# Patient Record
Sex: Female | Born: 2000 | Race: Black or African American | Hispanic: No | Marital: Single | Smoking: Current every day smoker
Health system: Southern US, Community
[De-identification: ages and names within clinical notes are randomized; demographics above are authoritative.]

## PROBLEM LIST (undated history)

## (undated) ENCOUNTER — Ambulatory Visit (HOSPITAL_COMMUNITY): Admission: EM | Payer: Medicaid Other

## (undated) DIAGNOSIS — J45909 Unspecified asthma, uncomplicated: Secondary | ICD-10-CM

## (undated) DIAGNOSIS — L0232 Furuncle of buttock: Secondary | ICD-10-CM

## (undated) DIAGNOSIS — L309 Dermatitis, unspecified: Secondary | ICD-10-CM

## (undated) DIAGNOSIS — T7840XA Allergy, unspecified, initial encounter: Secondary | ICD-10-CM

## (undated) HISTORY — DX: Unspecified asthma, uncomplicated: J45.909

## (undated) HISTORY — DX: Allergy, unspecified, initial encounter: T78.40XA

## (undated) HISTORY — PX: NO PAST SURGERIES: SHX2092

---
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
## 2018-01-01 ENCOUNTER — Other Ambulatory Visit: Payer: Self-pay | Admitting: Pediatrics

## 2018-01-01 DIAGNOSIS — J4530 Mild persistent asthma, uncomplicated: Secondary | ICD-10-CM

## 2018-01-05 ENCOUNTER — Other Ambulatory Visit: Payer: Self-pay | Admitting: Pediatrics

## 2018-01-05 DIAGNOSIS — J4530 Mild persistent asthma, uncomplicated: Secondary | ICD-10-CM

## 2018-03-06 ENCOUNTER — Encounter (HOSPITAL_COMMUNITY): Payer: Self-pay | Admitting: Emergency Medicine

## 2018-03-06 ENCOUNTER — Emergency Department (HOSPITAL_COMMUNITY)
Admission: EM | Admit: 2018-03-06 | Discharge: 2018-03-06 | Disposition: A | Discharge status: Home / Self Care | Payer: Medicaid Other | Attending: Emergency Medicine | Admitting: Emergency Medicine

## 2018-03-06 DIAGNOSIS — Z91010 Allergy to peanuts: Secondary | ICD-10-CM | POA: Diagnosis not present

## 2018-03-06 DIAGNOSIS — J45909 Unspecified asthma, uncomplicated: Secondary | ICD-10-CM | POA: Diagnosis not present

## 2018-03-06 DIAGNOSIS — H60502 Unspecified acute noninfective otitis externa, left ear: Secondary | ICD-10-CM | POA: Diagnosis not present

## 2018-03-06 DIAGNOSIS — Z7722 Contact with and (suspected) exposure to environmental tobacco smoke (acute) (chronic): Secondary | ICD-10-CM | POA: Insufficient documentation

## 2018-03-06 DIAGNOSIS — Z79899 Other long term (current) drug therapy: Secondary | ICD-10-CM | POA: Insufficient documentation

## 2018-03-06 DIAGNOSIS — H9202 Otalgia, left ear: Secondary | ICD-10-CM | POA: Diagnosis present

## 2018-03-06 MED ORDER — OFLOXACIN 0.3 % OT SOLN: 10.0000 [drp] | Freq: Every day | OTIC | 0 refills | Status: AC

## 2018-03-06 NOTE — Discharge Instructions (Addendum)
Please use the antibiotic drops as advised on your discharge paperwork.  Please follow-up with your primary doctor in 1 week for reevaluation return to the ER for any new or worsening symptoms.  You were noted to have high blood pressure today during your visit in the emergency department. You will need to follow up with your primary healthcare provider to have your blood pressure rechecked as you may need to be started on medication for this if it remains elevated. If you experience any chest pain, shortness of breath, headaches, vision changes, numbness, weakness, lightheadedness, changes in mental status, or decrease in urination you should return to the emergency department immediately.

## 2018-03-06 NOTE — ED Triage Notes (Signed)
Patient here from home with complaints of left ear pain x2 days. Headache. Advil with no relief.

## 2018-03-06 NOTE — ED Provider Notes (Addendum)
[facility]-EMERGENCY DEPT Provider Note   CSN: [id number] Arrival date & time: 03/06/18  1423     History   Chief Complaint Chief Complaint  Patient presents with  . Otalgia    HPI Gina Yang is a 17 y.o. female.  HPI  17 year old female with history of allergies, asthma, eczema who presents the emergency department today for evaluation of left ear pain which began 2 days ago.  States that she was swimming last week in [place].  She used a Q-tip yesterday and then felt like she had pain in her ear.  She notes some mild yellow drainage from the ear.  She has some subjective fevers at home.  States her grandma felt her head and thought she felt warm.  She has not taken any medications for her symptoms.  She states that she put hydrogen peroxide in her ear and use some Debrox that she had at home.  Symptoms are constant, severe.  No exacerbating or alleviating factors.  Past Medical History:  Diagnosis Date  . Allergy   . Asthma   . Boil of buttock   . Eczema     Patient Active Problem List   Diagnosis Date Noted  . Elevated BP without diagnosis of hypertension 06/02/2017  . Obesity 06/02/2017  . School problem 11/29/2015  . IUD (intrauterine device) in place 10/10/2015  . Hx MRSA infection 06/28/2015  . Acne 09/21/2014  . Eczema 12/28/2013  . Mild persistent asthma 12/28/2013  . Allergic rhinitis 12/28/2013    Past Surgical History:  Procedure Laterality Date  . NO PAST SURGERIES       OB History    Gravida  0   Para      Term      Preterm      AB      Living        SAB      TAB      Ectopic      Multiple      Live Births               Home Medications    Prior to Admission medications   Medication Sig Start Date End Date Taking? Authorizing Provider  beclomethasone (QVAR) 40 MCG/ACT inhaler Inhale 2 puffs into the lungs 2 (two) times daily. 06/05/16   Verneda Skill, FNP  diphenhydrAMINE (BENADRYL)  25 MG tablet Take 25 mg by mouth at bedtime.    [provider]  fluconazole (DIFLUCAN) 150 MG tablet Take 1 tablet today and 1 tablet three days from now 12/31/17   Verneda Skill, FNP  fluticasone [facility]) 50 MCG/ACT nasal spray Place 1 spray into both nostrils 2 (two) times daily. 09/30/16   Christianne Dolin, NP  ibuprofen (ADVIL,MOTRIN) 600 MG tablet Take 1 tablet (600 mg total) by mouth every 6 (six) hours as needed. 11/10/17   Ancil Linsey, MD  ofloxacin (FLOXIN) 0.3 % OTIC solution Place 10 drops into the left ear daily for 7 days. 03/06/18 03/13/18  Sian Joles S, PA-C  polyethylene glycol powder (GLYCOLAX/MIRALAX) powder Take 17 g by mouth daily. 05/26/17   Verneda Skill, FNP  PROAIR HFA 108 [phone] Base) MCG/ACT inhaler INHALE 2 PUFFS BY MOUTH EVERY 4 HOURS IF NEEDED FOR SHORTNESS OF BREATH WHEEZING 01/06/18   Ancil Linsey, MD  triamcinolone ointment (KENALOG) 0.5 % APPLY TO AFFECTED AREA TWICE A DAY 01/01/18   Christianne Dolin, NP    Family History Family  History  Problem Relation Age of Onset  . Eczema Brother     Social History Social History   Tobacco Use  . Smoking status: Passive Smoke Exposure - Never Smoker  . Smokeless tobacco: Never Used  . Tobacco comment: mom smokes  Substance Use Topics  . Alcohol use: No    Alcohol/week: 0.0 oz  . Drug use: No     Allergies   Morphine and related; Peanut-containing drug products; and Shrimp [shellfish allergy]   Review of Systems Review of Systems  Constitutional: Negative for fever.  HENT: Positive for ear pain. Negative for congestion, dental problem, postnasal drip, rhinorrhea and sore throat.   Eyes: Negative for visual disturbance.  Respiratory: Negative for shortness of breath.   Cardiovascular: Negative for chest pain.  Gastrointestinal: Negative for abdominal pain.  Genitourinary: Negative for pelvic pain.  Musculoskeletal: Negative for back pain.  Skin: Negative for wound.  Neurological:  Negative for headaches.   Physical Exam Updated Vital Signs BP (!) 144/100 (BP Location: Right Arm)   Pulse (!) 101   Temp 97.8 F (36.6 C) (Oral)   Resp 18   SpO2 98%   Physical Exam  Constitutional: She is oriented to person, place, and time. She appears well-developed and well-nourished. No distress.  HENT:  Head: Normocephalic and atraumatic.  Right TM without erythema or effusion.  External auditory canal on the left has findings consistent with otitis externa.  No mastoid tenderness.  Pharyngeal erythema.  No tonsillar swelling or exudates.  Uvula midline.  No cervical adenopathy.  Eyes: Pupils are equal, round, and reactive to light. Conjunctivae and EOM are normal.  Neck: Neck supple.  Cardiovascular: Normal rate and regular rhythm.  Pulmonary/Chest: Effort normal and breath sounds normal. She has no wheezes.  Musculoskeletal: Normal range of motion.  Neurological: She is alert and oriented to person, place, and time.  Skin: Skin is warm and dry. Capillary refill takes less than 2 seconds.  Psychiatric: She has a normal mood and affect.  Nursing note and vitals reviewed.  ED Treatments / Results  Labs (all labs ordered are listed, but only abnormal results are displayed) Labs Reviewed - No data to display  EKG None  Radiology No results found.  Procedures Procedures (including critical care time)  Medications Ordered in ED Medications - No data to display   Initial Impression / Assessment and Plan / ED Course  I have reviewed the triage vital signs and the nursing notes.  Pertinent labs & imaging results that were available during my care of the patient were reviewed by me and considered in my medical decision making (see chart for details).     Final Clinical Impressions(s) / ED Diagnoses   Final diagnoses:  Acute otitis externa of left ear, unspecified type   Pt presenting with otitis externa after swimming. Pt afebrile in NAD. Exam non concerning for  mastoiditis, cellulitis or malignant OE. Dc with ofloxacin script.  Advised pcp follow up in 2-3 days if no improvement with treatment or no complete resolution by 7 days. Return precautions discussed. All questions answered.  Patient's blood pressure elevated in the emergency department today. Patient denies headache, change in vision, numbness, weakness, chest pain, dyspnea, dizziness, or lightheadedness therefore doubt hypertensive emergency. Discussed elevated blood pressure with the patient and the need for primary care follow up with potential need to initiate or change antihypertensive medications and or for further evaluation. Advised return precaution signs/symptoms for hypertensive emergency as listed above with the patient.  ED Discharge Orders        Ordered    ofloxacin (FLOXIN) 0.3 % OTIC solution  Daily     03/06/18 [address] Eather Colas, PA-C 03/06/18 1625    Modean Mccullum S, PA-C 03/06/18 1626    Pricilla Loveless, MD 03/06/18 2236

## 2018-04-02 ENCOUNTER — Other Ambulatory Visit: Payer: Self-pay | Admitting: Pediatrics

## 2018-04-02 ENCOUNTER — Telehealth: Payer: Self-pay | Admitting: *Deleted

## 2018-04-02 MED ORDER — TRIAMCINOLONE ACETONIDE 0.5 % EX OINT: TOPICAL_OINTMENT | CUTANEOUS | 0 refills | Status: DC

## 2018-04-02 NOTE — Telephone Encounter (Signed)
Done

## 2018-04-02 NOTE — Telephone Encounter (Signed)
Patient calling for refill for triamcinolone cream.

## 2018-04-02 NOTE — Telephone Encounter (Signed)
Called and made patient aware. 

## 2018-04-13 ENCOUNTER — Ambulatory Visit: Payer: Medicaid Other

## 2018-04-15 ENCOUNTER — Ambulatory Visit: Payer: Medicaid Other | Admitting: Pediatrics

## 2018-04-28 ENCOUNTER — Inpatient Hospital Stay (HOSPITAL_COMMUNITY)
Admission: AD | Admit: 2018-04-28 | Discharge: 2018-04-29 | Discharge status: Against Medical Advice | Payer: Medicaid Other | Source: Ambulatory Visit | Attending: Obstetrics & Gynecology | Admitting: Obstetrics & Gynecology

## 2018-04-28 ENCOUNTER — Encounter (HOSPITAL_COMMUNITY): Payer: Self-pay

## 2018-04-28 DIAGNOSIS — Z3202 Encounter for pregnancy test, result negative: Secondary | ICD-10-CM | POA: Diagnosis present

## 2018-04-28 DIAGNOSIS — J45909 Unspecified asthma, uncomplicated: Secondary | ICD-10-CM | POA: Insufficient documentation

## 2018-04-28 DIAGNOSIS — Z7951 Long term (current) use of inhaled steroids: Secondary | ICD-10-CM | POA: Insufficient documentation

## 2018-04-28 DIAGNOSIS — Z7722 Contact with and (suspected) exposure to environmental tobacco smoke (acute) (chronic): Secondary | ICD-10-CM | POA: Insufficient documentation

## 2018-04-28 DIAGNOSIS — Z791 Long term (current) use of non-steroidal anti-inflammatories (NSAID): Secondary | ICD-10-CM | POA: Insufficient documentation

## 2018-04-28 DIAGNOSIS — Z91010 Allergy to peanuts: Secondary | ICD-10-CM | POA: Diagnosis not present

## 2018-04-28 DIAGNOSIS — Z79899 Other long term (current) drug therapy: Secondary | ICD-10-CM | POA: Insufficient documentation

## 2018-04-28 DIAGNOSIS — Z8489 Family history of other specified conditions: Secondary | ICD-10-CM | POA: Diagnosis not present

## 2018-04-28 DIAGNOSIS — Z91013 Allergy to seafood: Secondary | ICD-10-CM | POA: Insufficient documentation

## 2018-04-28 DIAGNOSIS — Z885 Allergy status to narcotic agent status: Secondary | ICD-10-CM | POA: Diagnosis not present

## 2018-04-28 LAB — POCT PREGNANCY, URINE: Preg Test, Ur: NEGATIVE

## 2018-04-28 NOTE — MAU Note (Addendum)
Pt here with reports of positive pregnancy test at home; nausea for past couple of days, reports some mild spotting, mid-abdominal pain. Has had an IUD in place for past 2 years.

## 2018-04-29 ENCOUNTER — Inpatient Hospital Stay (EMERGENCY_DEPARTMENT_HOSPITAL)
Admission: AD | Admit: 2018-04-29 | Discharge: 2018-04-29 | Disposition: A | Discharge status: Home / Self Care | Payer: Medicaid Other | Source: Ambulatory Visit | Attending: Obstetrics & Gynecology | Admitting: Obstetrics & Gynecology

## 2018-04-29 DIAGNOSIS — Z3202 Encounter for pregnancy test, result negative: Secondary | ICD-10-CM

## 2018-04-29 LAB — CBC
HCT: 35.7 % — ABNORMAL LOW (ref 36.0–46.0)
Hemoglobin: 11.9 g/dL — ABNORMAL LOW (ref 12.0–15.0)
MCH: 27.8 pg (ref 26.0–34.0)
MCHC: 33.3 g/dL (ref 30.0–36.0)
MCV: 83.4 fL (ref 78.0–100.0)
PLATELETS: 353 10*3/uL (ref 150–400)
RBC: 4.28 MIL/uL (ref 3.87–5.11)
RDW: 14.7 % (ref 11.5–15.5)
WBC: 7.4 10*3/uL (ref 4.0–10.5)

## 2018-04-29 LAB — URINALYSIS, ROUTINE W REFLEX MICROSCOPIC
Bilirubin Urine: NEGATIVE
GLUCOSE, UA: NEGATIVE mg/dL
HGB URINE DIPSTICK: NEGATIVE
Ketones, ur: NEGATIVE mg/dL
LEUKOCYTES UA: NEGATIVE
Nitrite: NEGATIVE
PH: 7 (ref 5.0–8.0)
Protein, ur: NEGATIVE mg/dL
Specific Gravity, Urine: 1.004 — ABNORMAL LOW (ref 1.005–1.030)

## 2018-04-29 LAB — ABO/RH: ABO/RH(D): A POS

## 2018-04-29 LAB — HCG, QUANTITATIVE, PREGNANCY

## 2018-04-29 NOTE — Discharge Instructions (Signed)

## 2018-04-29 NOTE — MAU Note (Signed)
Pt back after leaving AMA to go pick up her brother. Pt here for pregnancy confirmation. Having cramping, took plan B pill about 2200 tonight after her pregnancy test was positive.

## 2018-04-29 NOTE — MAU Note (Signed)
Pt called out saying she needs to leave now; has to go pick up her brother. She states that she will check back in later. Pt signed a AMA form and left the unit.

## 2018-04-29 NOTE — MAU Provider Note (Signed)
Chief Complaint: Possible Pregnancy   First Provider Initiated Contact with Patient 04/29/18 0117     SUBJECTIVE HPI: Taren Dymek is a 17 y.o. G0P0 non pregnant female who presents to Maternity Admissions reporting possible pregnancy. Had positive HPT earlier today with faint line. Had some spotting a few days ago. Denies abdominal pain. Has had IUD in place x 2 years.     Past Medical History:  Diagnosis Date  . Allergy   . Asthma   . Boil of buttock   . Eczema    OB History  Gravida Para Term Preterm AB Living  0            SAB TAB Ectopic Multiple Live Births              Past Surgical History:  Procedure Laterality Date  . NO PAST SURGERIES     Social History   Socioeconomic History  . Marital status: Single    Spouse name: Not on file  . Number of children: Not on file  . Years of education: Not on file  . Highest education level: Not on file  Occupational History  . Not on file  Social Needs  . Financial resource strain: Not on file  . Food insecurity:    Worry: Not on file    Inability: Not on file  . Transportation needs:    Medical: Not on file    Non-medical: Not on file  Tobacco Use  . Smoking status: Passive Smoke Exposure - Never Smoker  . Smokeless tobacco: Never Used  . Tobacco comment: mom smokes  Substance and Sexual Activity  . Alcohol use: No    Alcohol/week: 0.0 standard drinks  . Drug use: No  . Sexual activity: Never    Birth control/protection: IUD    Comment: pt reports she has never had sex  Lifestyle  . Physical activity:    Days per week: Not on file    Minutes per session: Not on file  . Stress: Not on file  Relationships  . Social connections:    Talks on phone: Not on file    Gets together: Not on file    Attends religious service: Not on file    Active member of club or organization: Not on file    Attends meetings of clubs or organizations: Not on file    Relationship status: Not on file  . Intimate partner  violence:    Fear of current or ex partner: Not on file    Emotionally abused: Not on file    Physically abused: Not on file    Forced sexual activity: Not on file  Other Topics Concern  . Not on file  Social History Narrative  . Not on file   Family History  Problem Relation Age of Onset  . Eczema Brother    No current facility-administered medications on file prior to encounter.    Current Outpatient Medications on File Prior to Encounter  Medication Sig Dispense Refill  . beclomethasone (QVAR) 40 MCG/ACT inhaler Inhale 2 puffs into the lungs 2 (two) times daily. 1 Inhaler 3  . diphenhydrAMINE (BENADRYL) 25 MG tablet Take 25 mg by mouth at bedtime.    . fluconazole (DIFLUCAN) 150 MG tablet Take 1 tablet today and 1 tablet three days from now 2 tablet 0  . fluticasone (FLONASE) 50 MCG/ACT nasal spray Place 1 spray into both nostrils 2 (two) times daily. 16 g 12  . ibuprofen (ADVIL,MOTRIN) 600 MG tablet  Take 1 tablet (600 mg total) by mouth every 6 (six) hours as needed. 30 tablet 0  . polyethylene glycol powder (GLYCOLAX/MIRALAX) powder Take 17 g by mouth daily. 578 g 6  . PROAIR HFA 108 (90 Base) MCG/ACT inhaler INHALE 2 PUFFS BY MOUTH EVERY 4 HOURS IF NEEDED FOR SHORTNESS OF BREATH WHEEZING 17 g 0  . triamcinolone ointment (KENALOG) 0.5 % APPLY TO AFFECTED AREA TWICE A DAY 30 g 0   Allergies  Allergen Reactions  . Morphine And Related Itching and Nausea And Vomiting  . Peanut-Containing Drug Products Swelling    Swelling in the throat  . Shrimp [Shellfish Allergy] Swelling    Swelling in the throat    I have reviewed patient's Past Medical Hx, Surgical Hx, Family Hx, Social Hx, medications and allergies.   Review of Systems  Gastrointestinal: Negative.   Genitourinary: Negative.     OBJECTIVE Patient Vitals for the past 24 hrs:  BP Temp Temp src Pulse Resp SpO2  04/29/18 0113 139/80 98 F (36.7 C) Oral 92 18 100 %   Constitutional: Well-developed, well-nourished  female in no acute distress.  Respiratory: normal rate and effort. Neurologic: Alert and oriented x 4.     LAB RESULTS Results for orders placed or performed during the hospital encounter of 04/29/18 (from the past 24 hour(s))  CBC     Status: Abnormal   Collection Time: 04/29/18  1:26 AM  Result Value Ref Range   WBC 7.4 4.0 - 10.5 K/uL   RBC 4.28 3.87 - 5.11 MIL/uL   Hemoglobin 11.9 (L) 12.0 - 15.0 g/dL   HCT 40.9 (L) 73.5 - 32.9 %   MCV 83.4 78.0 - 100.0 fL   MCH 27.8 26.0 - 34.0 pg   MCHC 33.3 30.0 - 36.0 g/dL   RDW 92.4 26.8 - 34.1 %   Platelets 353 150 - 400 K/uL  hCG, quantitative, pregnancy     Status: None   Collection Time: 04/29/18  1:26 AM  Result Value Ref Range   hCG, Beta Chain, Quant, S <1 <5 mIU/mL  ABO/Rh     Status: None (Preliminary result)   Collection Time: 04/29/18  1:26 AM  Result Value Ref Range   ABO/RH(D)      A POS Performed at [facility], [address] [place], [place] [address]     IMAGING No results found.  MAU COURSE Orders Placed This Encounter  Procedures  . CBC  . hCG, quantitative, pregnancy  . ABO/Rh  . Discharge patient   No orders of the defined types were placed in this encounter.   MDM UPT negative HCG negative  ASSESSMENT 1. Pregnancy examination or test, negative result     PLAN Discharge home in stable condition.  Follow-up Information    [name], MD Follow up.   Specialty:  Pediatrics Contact information: [address] [place] [address] [place] [address] [phone]          Allergies as of 04/29/2018      Reactions   Morphine And Related Itching, Nausea And Vomiting   Peanut-containing Drug Products Swelling   Swelling in the throat   Shrimp [shellfish Allergy] Swelling   Swelling in the throat      Medication List    TAKE these medications   beclomethasone 40 MCG/ACT inhaler Commonly known as:  QVAR Inhale 2 puffs into the lungs 2 (two) times daily.    diphenhydrAMINE 25 MG tablet Commonly known as:  BENADRYL Take  25 mg by mouth at bedtime.   fluconazole 150 MG tablet Commonly known as:  DIFLUCAN Take 1 tablet today and 1 tablet three days from now   fluticasone 50 MCG/ACT nasal spray Commonly known as:  FLONASE Place 1 spray into both nostrils 2 (two) times daily.   ibuprofen 600 MG tablet Commonly known as:  ADVIL,MOTRIN Take 1 tablet (600 mg total) by mouth every 6 (six) hours as needed.   polyethylene glycol powder powder Commonly known as:  GLYCOLAX/MIRALAX Take 17 g by mouth daily.   PROAIR HFA 108 (90 Base) MCG/ACT inhaler Generic drug:  albuterol INHALE 2 PUFFS BY MOUTH EVERY 4 HOURS IF NEEDED FOR SHORTNESS OF BREATH WHEEZING   triamcinolone ointment 0.5 % Commonly known as:  KENALOG APPLY TO AFFECTED AREA TWICE A Sanjuana Mae, NP 04/29/2018  2:52 AM

## 2018-05-05 ENCOUNTER — Ambulatory Visit: Payer: Medicaid Other | Admitting: Family

## 2018-05-10 ENCOUNTER — Ambulatory Visit: Payer: Medicaid Other | Admitting: Family

## 2018-07-10 ENCOUNTER — Other Ambulatory Visit: Payer: Self-pay | Admitting: Pediatrics

## 2018-07-14 ENCOUNTER — Emergency Department (HOSPITAL_COMMUNITY)
Admission: EM | Admit: 2018-07-14 | Discharge: 2018-07-14 | Disposition: A | Discharge status: Home / Self Care | Payer: Medicaid Other | Attending: Emergency Medicine | Admitting: Emergency Medicine

## 2018-07-14 ENCOUNTER — Other Ambulatory Visit: Payer: Self-pay

## 2018-07-14 ENCOUNTER — Emergency Department (HOSPITAL_COMMUNITY): Payer: Medicaid Other

## 2018-07-14 ENCOUNTER — Encounter (HOSPITAL_COMMUNITY): Payer: Self-pay

## 2018-07-14 DIAGNOSIS — R0789 Other chest pain: Secondary | ICD-10-CM | POA: Insufficient documentation

## 2018-07-14 DIAGNOSIS — Z79899 Other long term (current) drug therapy: Secondary | ICD-10-CM | POA: Insufficient documentation

## 2018-07-14 DIAGNOSIS — J45909 Unspecified asthma, uncomplicated: Secondary | ICD-10-CM | POA: Insufficient documentation

## 2018-07-14 DIAGNOSIS — R079 Chest pain, unspecified: Secondary | ICD-10-CM

## 2018-07-14 DIAGNOSIS — Z7722 Contact with and (suspected) exposure to environmental tobacco smoke (acute) (chronic): Secondary | ICD-10-CM | POA: Insufficient documentation

## 2018-07-14 DIAGNOSIS — Z91010 Allergy to peanuts: Secondary | ICD-10-CM | POA: Insufficient documentation

## 2018-07-14 LAB — D-DIMER, QUANTITATIVE (NOT AT ARMC): D DIMER QUANT: 0.32 ug{FEU}/mL (ref 0.00–0.50)

## 2018-07-14 MED ORDER — KETOROLAC TROMETHAMINE 30 MG/ML IJ SOLN
30.0000 mg | Freq: Once | INTRAMUSCULAR | Status: AC
  Administered 2018-07-14: 30 mg via INTRAMUSCULAR
  Filled 2018-07-14: qty 1

## 2018-07-14 MED ORDER — NAPROXEN 500 MG PO TABS: 500.0000 mg | ORAL_TABLET | Freq: Two times a day (BID) | ORAL | 0 refills | Status: DC

## 2018-07-14 NOTE — ED Triage Notes (Signed)
Pt reports L-sided rib pain. She states that it started this morning. She denies injury or SOB. She states that movement worsens the pain. A&Ox4. Ambulatory.

## 2018-07-14 NOTE — Discharge Instructions (Addendum)
You were seen today for left-sided chest pain.  Your work-up is reassuring.  Take naproxen as needed for pain.

## 2018-07-14 NOTE — ED Provider Notes (Signed)
[facility]-EMERGENCY DEPT Provider Note   CSN: [id number] Arrival date & time: 07/14/18  0025     History   Chief Complaint Chief Complaint  Patient presents with  . Rib Pain    HPI Gina Yang is a 17 y.o. female.  HPI  This is an 17 year old female who presents with left-sided chest pain.  Patient reports that she woke up with pain this morning.  She denies injury.  She states the pain is worse with deep breathing.  It is not worse with movement.  She denies any recent cough, fevers, shortness of breath.  She denies any urinary symptoms or flank pain.  No nausea or vomiting.  Currently she rates her pain at 7 out of 10.  She has not taken anything for pain.  Pain does not radiate.  She is on birth control.  She denies history of blood clots, leg swelling, recent hospitalization, recent long travel.  Past Medical History:  Diagnosis Date  . Allergy   . Asthma   . Boil of buttock   . Eczema     Patient Active Problem List   Diagnosis Date Noted  . Elevated BP without diagnosis of hypertension 06/02/2017  . Obesity 06/02/2017  . School problem 11/29/2015  . IUD (intrauterine device) in place 10/10/2015  . Hx MRSA infection 06/28/2015  . Acne 09/21/2014  . Eczema 12/28/2013  . Mild persistent asthma 12/28/2013  . Allergic rhinitis 12/28/2013    Past Surgical History:  Procedure Laterality Date  . NO PAST SURGERIES       OB History    Gravida  0   Para      Term      Preterm      AB      Living        SAB      TAB      Ectopic      Multiple      Live Births               Home Medications    Prior to Admission medications   Medication Sig Start Date End Date Taking? Authorizing Provider  Aspirin-Salicylamide-Caffeine (BC FAST PAIN RELIEF) [phone] MG PACK Take 1 Package by mouth every 6 (six) hours as needed (pain).   Yes [provider]  beclomethasone (QVAR) 40 MCG/ACT inhaler Inhale 2 puffs into  the lungs 2 (two) times daily. 06/05/16  Yes Verneda SkillHacker, Caroline T, FNP  fluticasone (FLONASE) 50 MCG/ACT nasal spray Place 1 spray into both nostrils 2 (two) times daily. 09/30/16  Yes Millican, Christy, NP  polyethylene glycol powder (GLYCOLAX/MIRALAX) powder Take 17 g by mouth daily. Patient taking differently: Take 17 g by mouth daily as needed for moderate constipation.  05/26/17  Yes Verneda SkillHacker, Caroline T, FNP  PROAIR HFA 108 (90 Base) MCG/ACT inhaler INHALE 2 PUFFS BY MOUTH EVERY 4 HOURS IF NEEDED FOR SHORTNESS OF BREATH WHEEZING Patient taking differently: Inhale 2 puffs into the lungs every 4 (four) hours as needed for wheezing.  01/06/18  Yes Ancil LinseyGrant, Khalia L, MD  triamcinolone ointment (KENALOG) 0.5 % APPLY EXTERNALLY TO THE AFFECTED AREA TWICE DAILY Patient taking differently: Apply 1 application topically 2 (two) times daily.  07/12/18  Yes Verneda SkillHacker, Caroline T, FNP  fluconazole (DIFLUCAN) 150 MG tablet Take 1 tablet today and 1 tablet three days from now Patient not taking: Reported on 07/14/2018 12/31/17   Verneda SkillHacker, Caroline T, FNP  ibuprofen (ADVIL,MOTRIN) 600 MG tablet Take 1  tablet (600 mg total) by mouth every 6 (six) hours as needed. Patient not taking: Reported on 07/14/2018 11/10/17   Ancil Linsey, MD  naproxen (NAPROSYN) 500 MG tablet Take 1 tablet (500 mg total) by mouth 2 (two) times daily. 07/14/18   Eutha Cude, Mayer Masker, MD    Family History Family History  Problem Relation Age of Onset  . Eczema Brother     Social History Social History   Tobacco Use  . Smoking status: Passive Smoke Exposure - Never Smoker  . Smokeless tobacco: Never Used  . Tobacco comment: mom smokes  Substance Use Topics  . Alcohol use: No    Alcohol/week: 0.0 standard drinks  . Drug use: No     Allergies   Morphine and related; Peanut-containing drug products; and Shrimp [shellfish allergy]   Review of Systems Review of Systems  Constitutional: Negative for fever.  Respiratory: Negative for  cough and shortness of breath.   Cardiovascular: Positive for chest pain. Negative for palpitations and leg swelling.  Gastrointestinal: Negative for abdominal pain, nausea and vomiting.  Genitourinary: Negative for dysuria and hematuria.  All other systems reviewed and are negative.    Physical Exam Updated Vital Signs BP (!) 149/96 (BP Location: Right Arm)   Pulse (!) 103   Temp 98.9 F (37.2 C) (Oral)   Resp 18   SpO2 100%   Physical Exam  Constitutional: She is oriented to person, place, and time. She appears well-developed and well-nourished.  Overweight, no acute distress  HENT:  Head: Normocephalic and atraumatic.  Neck: Neck supple.  Cardiovascular: Normal rate, regular rhythm and normal heart sounds.  Pulmonary/Chest: Effort normal and breath sounds normal. No respiratory distress. She has no wheezes. She exhibits tenderness.  Left chest wall tenderness to palpation, no crepitus or overlying skin changes  Abdominal: Soft. Bowel sounds are normal. There is no tenderness.  Neurological: She is alert and oriented to person, place, and time.  Skin: Skin is warm and dry.  Psychiatric: She has a normal mood and affect.  Nursing note and vitals reviewed.    ED Treatments / Results  Labs (all labs ordered are listed, but only abnormal results are displayed) Labs Reviewed  D-DIMER, QUANTITATIVE (NOT AT [facility] [place])    EKG EKG Interpretation  Date/Time:  Wednesday July 14 2018 03:14:45 EST Ventricular Rate:  82 PR Interval:    QRS Duration: 76 QT Interval:  341 QTC Calculation: 399 R Axis:   62 Text Interpretation:  Sinus rhythm Confirmed by Ross Marcus ([id number]) on 07/14/2018 3:20:46 AM   Radiology Dg Chest 2 View  Result Date: 07/14/2018 CLINICAL DATA:  Left lower anterior rib pain for 3 days. EXAM: CHEST - 2 VIEW COMPARISON:  April 17, 2016 FINDINGS: The heart size and mediastinal contours are within normal limits. Both lungs are clear. The visualized  skeletal structures are unremarkable. IMPRESSION: No active cardiopulmonary disease. Electronically Signed   By: Gerome Sam III M.D   On: 07/14/2018 03:00    Procedures Procedures (including critical care time)  Medications Ordered in ED Medications  ketorolac (TORADOL) 30 MG/ML injection 30 mg (30 mg Intramuscular Given 07/14/18 0303)     Initial Impression / Assessment and Plan / ED Course  I have reviewed the triage vital signs and the nursing notes.  Pertinent labs & imaging results that were available during my care of the patient were reviewed by me and considered in my medical decision making (see chart for details).     Patient  presents with left-sided chest pain.  She is overall nontoxic-appearing and vital signs are notable for mild tachycardia.  She has some reproducible tenderness on exam suggestive of musculoskeletal etiology.  However, she is slightly tachycardic and on birth control.  Pain is also worse with inspiration.  PE is a consideration.  EKG shows no signs of ischemia or arrhythmia.  Chest x-ray shows no evidence of pneumothorax or pneumonia.  Screening d-dimer is negative.  Patient is otherwise low risk and feel that this is adequate to rule out PE.  Patient improved with Toradol.  Recommend supportive measures at home.  After history, exam, and medical workup I feel the patient has been appropriately medically screened and is safe for discharge home. Pertinent diagnoses were discussed with the patient. Patient was given return precautions.   Final Clinical Impressions(s) / ED Diagnoses   Final diagnoses:  Left-sided chest pain    ED Discharge Orders         Ordered    naproxen (NAPROSYN) 500 MG tablet  2 times daily     07/14/18 0355           Shalamar Plourde, Mayer Masker, MD 07/14/18 [phone]

## 2018-08-13 ENCOUNTER — Encounter (HOSPITAL_BASED_OUTPATIENT_CLINIC_OR_DEPARTMENT_OTHER): Payer: Self-pay

## 2018-08-13 ENCOUNTER — Emergency Department (HOSPITAL_BASED_OUTPATIENT_CLINIC_OR_DEPARTMENT_OTHER): Payer: Medicaid Other

## 2018-08-13 ENCOUNTER — Other Ambulatory Visit: Payer: Self-pay

## 2018-08-13 ENCOUNTER — Emergency Department (HOSPITAL_BASED_OUTPATIENT_CLINIC_OR_DEPARTMENT_OTHER)
Admission: EM | Admit: 2018-08-13 | Discharge: 2018-08-13 | Disposition: A | Discharge status: Home / Self Care | Payer: Medicaid Other | Attending: Emergency Medicine | Admitting: Emergency Medicine

## 2018-08-13 DIAGNOSIS — J4530 Mild persistent asthma, uncomplicated: Secondary | ICD-10-CM | POA: Insufficient documentation

## 2018-08-13 DIAGNOSIS — Z79899 Other long term (current) drug therapy: Secondary | ICD-10-CM | POA: Insufficient documentation

## 2018-08-13 DIAGNOSIS — S6991XA Unspecified injury of right wrist, hand and finger(s), initial encounter: Secondary | ICD-10-CM | POA: Diagnosis present

## 2018-08-13 DIAGNOSIS — Y939 Activity, unspecified: Secondary | ICD-10-CM | POA: Insufficient documentation

## 2018-08-13 DIAGNOSIS — Y929 Unspecified place or not applicable: Secondary | ICD-10-CM | POA: Insufficient documentation

## 2018-08-13 DIAGNOSIS — F17210 Nicotine dependence, cigarettes, uncomplicated: Secondary | ICD-10-CM | POA: Diagnosis not present

## 2018-08-13 DIAGNOSIS — W230XXA Caught, crushed, jammed, or pinched between moving objects, initial encounter: Secondary | ICD-10-CM | POA: Insufficient documentation

## 2018-08-13 DIAGNOSIS — Y999 Unspecified external cause status: Secondary | ICD-10-CM | POA: Diagnosis not present

## 2018-08-13 LAB — PREGNANCY, URINE: PREG TEST UR: NEGATIVE

## 2018-08-13 MED ORDER — ACETAMINOPHEN 325 MG PO TABS
650.0000 mg | ORAL_TABLET | Freq: Once | ORAL | Status: AC
  Administered 2018-08-13: 650 mg via ORAL
  Filled 2018-08-13: qty 2

## 2018-08-13 NOTE — ED Triage Notes (Signed)
Pt states she slammed right middle finger in a door today-no break in skin-NAD-steady gait

## 2018-08-13 NOTE — ED Provider Notes (Signed)
[facility] EMERGENCY DEPARTMENT Provider Note   CSN: [id number] Arrival date & time: 08/13/18  1828     History   Chief Complaint Chief Complaint  Patient presents with  . Finger Injury    HPI Lang SnowJabria Bonet is a 17 y.o. female.  Lang SnowJabria Smyser is a [age] y.o. female history of allergies and asthma, who presents to the emergency department for evaluation of finger injury.  She reports just prior to arrival she slammed her right middle finger in the door.  This did not cause any laceration or break in the skin but she reports since then she has had pain over the middle and distal phalanx.  She has not noted any discoloration or damage to the nail.  She is able to bend the finger but reports movement and palpation make pain worse.  She denies any numbness tingling or weakness.  No discoloration of the finger.  She has not taken anything prior to arrival for pain, denies any other aggravating or alleviating factors.     Past Medical History:  Diagnosis Date  . Allergy   . Asthma   . Boil of buttock   . Eczema     Patient Active Problem List   Diagnosis Date Noted  . Elevated BP without diagnosis of hypertension 06/02/2017  . Obesity 06/02/2017  . School problem 11/29/2015  . IUD (intrauterine device) in place 10/10/2015  . Hx MRSA infection 06/28/2015  . Acne 09/21/2014  . Eczema 12/28/2013  . Mild persistent asthma 12/28/2013  . Allergic rhinitis 12/28/2013    Past Surgical History:  Procedure Laterality Date  . NO PAST SURGERIES       OB History    Gravida  0   Para      Term      Preterm      AB      Living        SAB      TAB      Ectopic      Multiple      Live Births               Home Medications    Prior to Admission medications   Medication Sig Start Date End Date Taking? Authorizing Provider  Aspirin-Salicylamide-Caffeine (BC FAST PAIN RELIEF) 650-195-33.3 MG PACK Take 1 Package by mouth every 6 (six) hours as needed  (pain).    [provider]  beclomethasone (QVAR) 40 MCG/ACT inhaler Inhale 2 puffs into the lungs 2 (two) times daily. 06/05/16   Verneda SkillHacker, Caroline T, FNP  fluconazole (DIFLUCAN) 150 MG tablet Take 1 tablet today and 1 tablet three days from now Patient not taking: Reported on 07/14/2018 12/31/17   Verneda SkillHacker, Caroline T, FNP  fluticasone [facility](FLONASE) 50 MCG/ACT nasal spray Place 1 spray into both nostrils 2 (two) times daily. 09/30/16   Christianne DolinMillican, Christy, NP  ibuprofen (ADVIL,MOTRIN) 600 MG tablet Take 1 tablet (600 mg total) by mouth every 6 (six) hours as needed. Patient not taking: Reported on 07/14/2018 11/10/17   Ancil LinseyGrant, Khalia L, MD  naproxen (NAPROSYN) 500 MG tablet Take 1 tablet (500 mg total) by mouth 2 (two) times daily. 07/14/18   Horton, Mayer Maskerourtney F, MD  polyethylene glycol powder (GLYCOLAX/MIRALAX) powder Take 17 g by mouth daily. Patient taking differently: Take 17 g by mouth daily as needed for moderate constipation.  05/26/17   Verneda SkillHacker, Caroline T, FNP  PROAIR HFA 108 [phone](90 Base) MCG/ACT inhaler INHALE 2 PUFFS BY MOUTH EVERY 4  HOURS IF NEEDED FOR SHORTNESS OF BREATH WHEEZING Patient taking differently: Inhale 2 puffs into the lungs every 4 (four) hours as needed for wheezing.  01/06/18   Ancil Linsey, MD  triamcinolone ointment (KENALOG) 0.5 % APPLY EXTERNALLY TO THE AFFECTED AREA TWICE DAILY Patient taking differently: Apply 1 application topically 2 (two) times daily.  07/12/18   Verneda Skill, FNP    Family History Family History  Problem Relation Age of Onset  . Eczema Brother     Social History Social History   Tobacco Use  . Smoking status: Current Every Day Smoker    Types: Cigars  . Smokeless tobacco: Never Used  . Tobacco comment: mom smokes  Substance Use Topics  . Alcohol use: No    Alcohol/week: 0.0 standard drinks  . Drug use: No     Allergies   Morphine and related; Peanut-containing drug products; and Shrimp [shellfish allergy]   Review of  Systems Review of Systems  Constitutional: Negative for chills and fever.  Musculoskeletal: Positive for arthralgias and joint swelling.  Skin: Negative for color change, rash and wound.  Neurological: Negative for weakness and numbness.     Physical Exam Updated Vital Signs BP 139/86 (BP Location: Left Arm)   Pulse (!) 111   Temp 98.3 F (36.8 C)   Resp 18   Wt 95.5 kg   SpO2 100%   BMI 33.47 kg/m   Physical Exam Vitals signs and nursing note reviewed.  Constitutional:      General: She is not in acute distress.    Appearance: She is well-developed. She is not diaphoretic.  HENT:     Head: Normocephalic and atraumatic.  Eyes:     General:        Right eye: No discharge.        Left eye: No discharge.  Pulmonary:     Effort: Pulmonary effort is normal. No respiratory distress.  Musculoskeletal:     Comments: Tenderness to palpation over the middle and distal phalanx of the right middle finger small amount of swelling noted no erythema, compartments are soft, no evidence of damage to the nail or subungual hematoma, will to flex and extend the finger at all joints with some discomfort.  Sensation intact, 2+ radial pulse and good capillary refill, no pain or tenderness to any of the other fingers or hand.  Skin:    General: Skin is warm and dry.     Capillary Refill: Capillary refill takes less than 2 seconds.  Neurological:     Mental Status: She is alert and oriented to person, place, and time. Mental status is at baseline.     Coordination: Coordination normal.  Psychiatric:        Mood and Affect: Mood normal.        Behavior: Behavior normal.      ED Treatments / Results  Labs (all labs ordered are listed, but only abnormal results are displayed) Labs Reviewed  PREGNANCY, URINE    EKG None  Radiology Dg Finger Middle Right  Result Date: 08/13/2018 CLINICAL DATA:  Middle finger injury EXAM: RIGHT MIDDLE FINGER 2+V COMPARISON:  None. FINDINGS: There is  no evidence of fracture or dislocation. There is no evidence of arthropathy or other focal bone abnormality. Soft tissues are unremarkable. IMPRESSION: Negative. Electronically Signed   By: Jasmine Pang M.D.   On: 08/13/2018 18:58    Procedures Procedures (including critical care time)  Medications Ordered in ED Medications  acetaminophen (TYLENOL)  tablet 650 mg (650 mg Oral Given 08/13/18 1951)     Initial Impression / Assessment and Plan / ED Course  I have reviewed the triage vital signs and the nursing notes.  Pertinent labs & imaging results that were available during my care of the patient were reviewed by me and considered in my medical decision making (see chart for details).  Presents for evaluation of finger injury after she slammed her right middle finger in the car door.  Finger is without any obvious deformity it is neurovascularly intact there is no evidence of damage to the nailbed, no laceration or break in the skin.  X-ray shows no evidence of fracture.  Patient did request that we send a urine pregnancy which was negative.  Will place in splint, encouraged Tylenol, ibuprofen, ice and elevation and follow-up with PCP if symptoms not improving.  Return precautions discussed.  Patient is stable for discharge home at this time.  Final Clinical Impressions(s) / ED Diagnoses   Final diagnoses:  Injury of finger of right hand, initial encounter    ED Discharge Orders    None       Legrand RamsFord, Renny Gunnarson N, PA-C 08/13/18 2006    Rolan BuccoBelfi, Melanie, MD 08/13/18 2125

## 2018-08-13 NOTE — Discharge Instructions (Addendum)
X-ray shows no evidence of fracture.  Use finger splint to help protect the finger.  Use ibuprofen and Tylenol as well as ice and elevation to help with pain.  Follow-up with your primary care doctor if symptoms are not improved in the next week.

## 2018-09-22 ENCOUNTER — Other Ambulatory Visit: Payer: Self-pay | Admitting: Pediatrics

## 2018-09-22 NOTE — Telephone Encounter (Signed)
CALL BACK NUMBER:  [phone]  MEDICATION(S): triamcinolone ointment (KENALOG) 0.5 %     PREFERRED PHARMACY: [facility] [phone] - [place], [place] - [address]  ARE YOU CURRENTLY COMPLETELY OUT OF THE MEDICATION? :  Yes

## 2018-09-23 MED ORDER — TRIAMCINOLONE ACETONIDE 0.5 % EX OINT: TOPICAL_OINTMENT | CUTANEOUS | 0 refills | Status: DC

## 2018-09-30 ENCOUNTER — Encounter: Payer: Self-pay | Admitting: Family

## 2018-09-30 ENCOUNTER — Ambulatory Visit (INDEPENDENT_AMBULATORY_CARE_PROVIDER_SITE_OTHER): Payer: Medicaid Other | Admitting: Family

## 2018-09-30 ENCOUNTER — Other Ambulatory Visit: Payer: Self-pay | Admitting: Pediatrics

## 2018-09-30 VITALS — BP 124/78 | HR 77 | Ht 66.24 in | Wt 204.8 lb

## 2018-09-30 DIAGNOSIS — Z975 Presence of (intrauterine) contraceptive device: Secondary | ICD-10-CM | POA: Diagnosis not present

## 2018-09-30 DIAGNOSIS — N9089 Other specified noninflammatory disorders of vulva and perineum: Secondary | ICD-10-CM | POA: Diagnosis not present

## 2018-09-30 DIAGNOSIS — Z113 Encounter for screening for infections with a predominantly sexual mode of transmission: Secondary | ICD-10-CM

## 2018-09-30 NOTE — Patient Instructions (Signed)
We will call you with results and schedule your follow up

## 2018-10-01 LAB — WET PREP BY MOLECULAR PROBE
CANDIDA SPECIES: NOT DETECTED
GARDNERELLA VAGINALIS: NOT DETECTED
MICRO NUMBER:: 160999
SPECIMEN QUALITY: ADEQUATE
TRICHOMONAS VAG: NOT DETECTED

## 2018-10-01 LAB — RPR: RPR Ser Ql: NONREACTIVE

## 2018-10-01 LAB — C. TRACHOMATIS/N. GONORRHOEAE RNA
C. trachomatis RNA, TMA: NOT DETECTED
C. trachomatis RNA, TMA: NOT DETECTED
N. gonorrhoeae RNA, TMA: NOT DETECTED
N. gonorrhoeae RNA, TMA: NOT DETECTED

## 2018-10-01 LAB — HIV ANTIBODY (ROUTINE TESTING W REFLEX): HIV 1&2 Ab, 4th Generation: NONREACTIVE

## 2018-10-02 LAB — HSV DNA BY PCR (REFERENCE LAB): HSV 2 DNA: NOT DETECTED

## 2018-10-02 LAB — HERPES SIMPLEX VIRUS(HSV) DNA BY PCR: HSV 1 DNA: NOT DETECTED

## 2018-10-08 ENCOUNTER — Encounter: Payer: Self-pay | Admitting: Pediatrics

## 2018-10-08 ENCOUNTER — Ambulatory Visit (INDEPENDENT_AMBULATORY_CARE_PROVIDER_SITE_OTHER): Payer: Medicaid Other | Admitting: Family

## 2018-10-08 ENCOUNTER — Other Ambulatory Visit: Payer: Self-pay | Admitting: Family

## 2018-10-08 VITALS — BP 135/80 | HR 88 | Ht 67.0 in | Wt 204.0 lb

## 2018-10-08 DIAGNOSIS — N898 Other specified noninflammatory disorders of vagina: Secondary | ICD-10-CM | POA: Diagnosis not present

## 2018-10-08 DIAGNOSIS — Z975 Presence of (intrauterine) contraceptive device: Secondary | ICD-10-CM | POA: Diagnosis not present

## 2018-10-08 DIAGNOSIS — Z3202 Encounter for pregnancy test, result negative: Secondary | ICD-10-CM | POA: Diagnosis not present

## 2018-10-11 ENCOUNTER — Encounter: Payer: Self-pay | Admitting: Family

## 2018-10-11 LAB — HERPES SIMPLEX VIRUS(HSV) DNA BY PCR
HSV 1 DNA: NOT DETECTED
HSV 2 DNA: NOT DETECTED

## 2018-10-11 LAB — POCT URINE PREGNANCY: Preg Test, Ur: NEGATIVE

## 2018-10-11 NOTE — Progress Notes (Signed)
History was provided by the patient.  Gina Yang is a 18 y.o. female who is here for vaginal irritation, string check.   PCP confirmed? Yes.    [name], MD  HPI:   -feels like she has a lesion near her clitoris -wants to be tested for HSV -she also wants reassurance that IUD is working  -of note, she was screened for HIV, RPR, gc/c, and wet prep on 09/30/2018, all of which were negative. At that time, she also had a negative HSV test, however she feels the symptoms are worse; pain at site when urinating. Resolving as of today but still wants to be checked.     Review of Systems  Constitutional: Negative for fever and malaise/fatigue.  HENT: Negative for sore throat.   Eyes: Negative for blurred vision and pain.  Respiratory: Negative for cough and shortness of breath.   Gastrointestinal: Negative for abdominal pain and nausea.  Genitourinary: Negative for dysuria and frequency.  Musculoskeletal: Negative for myalgias.  Skin: Negative for rash.  Neurological: Negative for tingling.  Psychiatric/Behavioral: The patient is nervous/anxious.      Patient Active Problem List   Diagnosis Date Noted  . Elevated BP without diagnosis of hypertension 06/02/2017  . Obesity 06/02/2017  . School problem 11/29/2015  . IUD (intrauterine device) in place 10/10/2015  . Hx MRSA infection 06/28/2015  . Acne 09/21/2014  . Eczema 12/28/2013  . Mild persistent asthma 12/28/2013  . Allergic rhinitis 12/28/2013    Current Outpatient Medications on File Prior to Visit  Medication Sig Dispense Refill  . Aspirin-Salicylamide-Caffeine (BC FAST PAIN RELIEF) 650-195-33.3 MG PACK Take 1 Package by mouth every 6 (six) hours as needed (pain).    Marland Kitchen ibuprofen (ADVIL,MOTRIN) 600 MG tablet Take 1 tablet (600 mg total) by mouth every 6 (six) hours as needed. 30 tablet 0  . naproxen (NAPROSYN) 500 MG tablet Take 1 tablet (500 mg total) by mouth 2 (two) times daily. 30 tablet 0  . triamcinolone  ointment (KENALOG) 0.5 % APPLY EXTERNALLY TO THE AFFECTED AREA TWICE DAILY 30 g 0  . beclomethasone (QVAR) 40 MCG/ACT inhaler Inhale 2 puffs into the lungs 2 (two) times daily. (Patient not taking: Reported on 09/30/2018) 1 Inhaler 3  . fluconazole (DIFLUCAN) 150 MG tablet Take 1 tablet today and 1 tablet three days from now (Patient not taking: Reported on 07/14/2018) 2 tablet 0  . fluticasone (FLONASE) 50 MCG/ACT nasal spray Place 1 spray into both nostrils 2 (two) times daily. (Patient not taking: Reported on 09/30/2018) 16 g 12  . polyethylene glycol powder (GLYCOLAX/MIRALAX) powder Take 17 g by mouth daily. (Patient not taking: Reported on 09/30/2018) 578 g 6  . PROAIR HFA 108 (90 Base) MCG/ACT inhaler INHALE 2 PUFFS BY MOUTH EVERY 4 HOURS IF NEEDED FOR SHORTNESS OF BREATH WHEEZING (Patient not taking: No sig reported) 17 g 0   No current facility-administered medications on file prior to visit.     Allergies  Allergen Reactions  . Morphine And Related Itching and Nausea And Vomiting  . Peanut-Containing Drug Products Swelling    Swelling in the throat  . Shrimp [Shellfish Allergy] Swelling    Swelling in the throat    Physical Exam:    Vitals:   10/08/18 0949  BP: 135/80  Pulse: 88  Weight: 204 lb (92.5 kg)  Height: 5\' 7"  (1.702 m)    Blood pressure percentiles are not available for patients who are 18 years or older. No LMP recorded. (Menstrual  status: IUD).  Physical Exam Exam conducted with a chaperone present.  HENT:     Mouth/Throat:     Mouth: Mucous membranes are moist.     Pharynx: No oropharyngeal exudate.  Eyes:     Extraocular Movements: Extraocular movements intact.     Pupils: Pupils are equal, round, and reactive to light.  Neck:     Musculoskeletal: Normal range of motion.  Cardiovascular:     Rate and Rhythm: Normal rate and regular rhythm.     Heart sounds: No murmur.  Pulmonary:     Effort: Pulmonary effort is normal.  Genitourinary:    General:  Normal vulva.     Vagina: No vaginal discharge.     Rectum: Normal.     Comments: Strings present Pinpoint sized area of erythema on R clitoral hood; skin intact, no blistering or ulcerative lesions noted Lymphadenopathy:     Cervical: No cervical adenopathy.  Neurological:     Mental Status: She is alert.      Assessment/Plan: 1. Vaginal irritation -will rescreen; low suspicion for HSV -reassurance tiven  - HSV PCR  2. Negative pregnancy test -negative, reassurance given that strings visible  - POCT urine pregnancy  3. IUD (intrauterine device) in place -as above -condom use discussed -return precautions given

## 2018-10-18 ENCOUNTER — Encounter: Payer: Self-pay | Admitting: Family

## 2018-10-18 NOTE — Progress Notes (Signed)
History was provided by the patient.  Gina Yang is a 18 y.o. female who is here for vaginal lesion, IUD in place.   PCP confirmed? Yes.    [name], MD  HPI:   -she has vaginal lesion near clitoris x 1-2 days  -sexually active with female partner -IUD in place -no breakthrough bleeding or discharge changes -no pain with intercourse, no pelvic or abdominal pain  -no prodrome   Review of Systems  Constitutional: Negative for chills and malaise/fatigue.  Gastrointestinal: Negative for abdominal pain, nausea and vomiting.  Genitourinary: Negative for dysuria and urgency.       Irritation near clitoris   Musculoskeletal: Negative for joint pain and myalgias.  Skin: Negative for rash.  Neurological: Negative for dizziness and headaches.     Patient Active Problem List   Diagnosis Date Noted  . Elevated BP without diagnosis of hypertension 06/02/2017  . Obesity 06/02/2017  . School problem 11/29/2015  . IUD (intrauterine device) in place 10/10/2015  . Hx MRSA infection 06/28/2015  . Acne 09/21/2014  . Eczema 12/28/2013  . Mild persistent asthma 12/28/2013  . Allergic rhinitis 12/28/2013    Current Outpatient Medications on File Prior to Visit  Medication Sig Dispense Refill  . Aspirin-Salicylamide-Caffeine (BC FAST PAIN RELIEF) 650-195-33.3 MG PACK Take 1 Package by mouth every 6 (six) hours as needed (pain).    Marland Kitchen ibuprofen (ADVIL,MOTRIN) 600 MG tablet Take 1 tablet (600 mg total) by mouth every 6 (six) hours as needed. 30 tablet 0  . naproxen (NAPROSYN) 500 MG tablet Take 1 tablet (500 mg total) by mouth 2 (two) times daily. 30 tablet 0  . beclomethasone (QVAR) 40 MCG/ACT inhaler Inhale 2 puffs into the lungs 2 (two) times daily. (Patient not taking: Reported on 09/30/2018) 1 Inhaler 3  . fluconazole (DIFLUCAN) 150 MG tablet Take 1 tablet today and 1 tablet three days from now (Patient not taking: Reported on 07/14/2018) 2 tablet 0  . fluticasone (FLONASE) 50 MCG/ACT  nasal spray Place 1 spray into both nostrils 2 (two) times daily. (Patient not taking: Reported on 09/30/2018) 16 g 12  . polyethylene glycol powder (GLYCOLAX/MIRALAX) powder Take 17 g by mouth daily. (Patient not taking: Reported on 09/30/2018) 578 g 6  . PROAIR HFA 108 (90 Base) MCG/ACT inhaler INHALE 2 PUFFS BY MOUTH EVERY 4 HOURS IF NEEDED FOR SHORTNESS OF BREATH WHEEZING (Patient not taking: No sig reported) 17 g 0  . triamcinolone ointment (KENALOG) 0.5 % APPLY EXTERNALLY TO THE AFFECTED AREA TWICE DAILY 30 g 0   No current facility-administered medications on file prior to visit.     Allergies  Allergen Reactions  . Morphine And Related Itching and Nausea And Vomiting  . Peanut-Containing Drug Products Swelling    Swelling in the throat  . Shrimp [Shellfish Allergy] Swelling    Swelling in the throat    Physical Exam:    Vitals:   09/30/18 1102  BP: 124/78  Pulse: 77  Weight: 204 lb 12.8 oz (92.9 kg)  Height: 5' 6.24" (1.682 m)    Blood pressure percentiles are not available for patients who are 18 years or older. No LMP recorded. (Menstrual status: IUD).  Physical Exam Vitals signs reviewed. Exam conducted with a chaperone present.  Constitutional:      Appearance: Normal appearance.  HENT:     Head: Normocephalic.     Mouth/Throat:     Mouth: Mucous membranes are moist.     Pharynx: No oropharyngeal exudate.  Eyes:     Pupils: Pupils are equal, round, and reactive to light.  Cardiovascular:     Rate and Rhythm: Normal rate and regular rhythm.     Heart sounds: No murmur.  Pulmonary:     Effort: Pulmonary effort is normal.  Genitourinary:    General: Normal vulva.     Exam position: Lithotomy position.     Pubic Area: No rash.      Rectum: Normal.     Comments: Mild erethema noted on clitoral hood  No ulceration, no excoriation, no draingage  Strings visible on exam Cervix normal, not friable  Neurological:     Mental Status: She is alert.      Assessment/Plan: 1. Clitoral irritation -will screen for HSV although not highly suspicious.    2. Routine screening for STI (sexually transmitted infection) -per patient request, will also screen for RPR and HIV - RPR - WET PREP BY MOLECULAR PROBE - C. trachomatis/N. gonorrhoeae RNA - HIV Antibody (routine testing w rflx) - HSV PCR  3. IUD (intrauterine device) in place -continue with method -return precautions given  -condom use reviewed

## 2018-11-01 ENCOUNTER — Ambulatory Visit: Payer: Medicaid Other

## 2018-11-03 ENCOUNTER — Other Ambulatory Visit: Payer: Self-pay

## 2018-11-03 ENCOUNTER — Encounter: Payer: Self-pay | Admitting: Pediatrics

## 2018-11-03 ENCOUNTER — Ambulatory Visit (INDEPENDENT_AMBULATORY_CARE_PROVIDER_SITE_OTHER): Payer: Medicaid Other | Admitting: Pediatrics

## 2018-11-03 VITALS — BP 118/78 | HR 90 | Ht 66.26 in | Wt 203.6 lb

## 2018-11-03 DIAGNOSIS — Z111 Encounter for screening for respiratory tuberculosis: Secondary | ICD-10-CM | POA: Diagnosis not present

## 2018-11-03 DIAGNOSIS — Z0001 Encounter for general adult medical examination with abnormal findings: Secondary | ICD-10-CM

## 2018-11-03 DIAGNOSIS — Z113 Encounter for screening for infections with a predominantly sexual mode of transmission: Secondary | ICD-10-CM

## 2018-11-03 DIAGNOSIS — Z68.41 Body mass index (BMI) pediatric, 5th percentile to less than 85th percentile for age: Secondary | ICD-10-CM

## 2018-11-03 DIAGNOSIS — J4530 Mild persistent asthma, uncomplicated: Secondary | ICD-10-CM

## 2018-11-03 DIAGNOSIS — E669 Obesity, unspecified: Secondary | ICD-10-CM

## 2018-11-03 MED ORDER — ALBUTEROL SULFATE HFA 108 (90 BASE) MCG/ACT IN AERS: 2.0000 | INHALATION_SPRAY | Freq: Four times a day (QID) | RESPIRATORY_TRACT | 0 refills | Status: DC | PRN

## 2018-11-03 NOTE — Patient Instructions (Signed)
Adult Primary Care Clinics Name Criteria Services   [facility]  Address: [address] [place], [place] [address]  Phone: [phone] Hours: Monday - Friday 9 AM -6 PM  Types of insurance accepted:  Commercial insurance [facility] (orange card) Medicaid Medicare Uninsured  Language services:  Video and phone interpreters available   Ages 18 and older    Adult primary care Onsite pharmacy Integrated behavioral health Financial assistance counseling Walk-in hours for established patients  Financial assistance counseling hours: Tuesdays 2:00PM - 5:00PM  Thursday 8:30AM - 4:30PM  Space is limited, 10 on Tuesday and 20 on Thursday. It's on first come first serve basis  Name Criteria Services   [facility]  Address: [address] [place], [place] [address]  Phone: [phone]  Hours: Monday - Friday 8:30 AM - 5 PM  Types of insurance accepted:  Commercial insurance Medicaid Medicare Uninsured  Language services:  Video and phone interpreters available   All ages - newborn to adult   Primary care for all ages (children and adults) Integrated behavioral health Nutritionist Financial assistance counseling   Name Criteria Services   [facility]  Located on the ground floor of [facility]  Address: [address]  [place],  [place]  [address]  Phone: [phone]  Hours: Monday - Friday 8:15 AM - 5 PM  Types of insurance accepted:  Commercial insurance Medicaid Medicare Uninsured  Language services:  Video and phone interpreters available   Ages 18 and older   Adult primary care Nutritionist Certified Diabetes Educator  Integrated behavioral health Financial assistance counseling   Name Criteria Services   [facility]  Address: [address] [place], [place] [address]  Phone:  [phone]  Hours: Monday - Friday 8:30 AM - 5 PM    Types of insurance accepted:  Commercial insurance Medicaid Medicare Uninsured  Language services:  Video and phone interpreters available   All ages - newborn to adult   Primary care for all ages (children and adults) Integrated behavioral health Financial assistance counseling    

## 2018-11-03 NOTE — Progress Notes (Signed)
Adolescent Well Care Visit Gina Yang is a 18 y.o. female who is here for well care.    PCP:  Gina File, MD   History was provided by the patient.  Confidentiality was discussed with the patient and, if applicable, with caregiver as well. Patient's personal or confidential phone number: [phone]  Current Issues: Current concerns include: Doing well with no specific concerns today.  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 No health issues today.  She was seen in the adolescent pod last month for a possible HSV infection.  Swab for HSV was sent but that was negative.  She also has an IUD in place that was checked at the last visit on 10/08/2018.  She had a normal GC chlamydia screen, normal wet prep, normal HIV and RPR test at that visit. Patient reports to not be sexually active currently but her last sexual encounter was last month.  Does not have a partner currently.   H/o asthma in the past- no exacerbation or albuterol use in several years.  Nutrition: Nutrition/Eating Behaviors: Eats a variety of fruits, vegetables.  Has started making healthy choices with meals.  She does like junk food and reports to drink juice or soda every day Adequate calcium in diet?:  Milk with cereal or yogurt Supplements/ Vitamins: No  Exercise/ Media: Play any Sports?/ Exercise: Goes to the gym every other day and usually exercises  for 45 to 60 minutes. Screen Time:  > 2 hours-counseling provided Media Rules or Monitoring?: no  Sleep:  Sleep: No issues  Social Screening: Lives with: Mom and 2 younger brothers.  37 year old sister lives with grandmom.  Older brother who is 51 years old has moved out.  She plans to move out of mom's house when she can afford it.  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  no Stressors of note: no  Education: School Name: Graduated from high school.  Wants to go to college for fall 2020.  Wants to pursue nursing  Menstruation:   No LMP recorded. (Menstrual status: IUD). No cycle since placement of IUD  Confidential Social History: Tobacco?  no Secondhand smoke exposure?  no Drugs/ETOH?  no  Sexually Active?  yes   Pregnancy Prevention: IUD  Safe at home, in school & in relationships?  Yes Safe to self?  Yes   Screenings: Patient has a dental home: yes  The patient completed the Rapid Assessment for Adolescent Preventive Services screening questionnaire and the following topics were identified as risk factors and discussed: healthy eating, exercise, tobacco use, marijuana use, drug use, condom use and mental health issues  I PHQ-9 completed and results indicated: no concerns  Physical Exam:  Vitals:   11/03/18 1040  BP: 118/78  Pulse: 90  Weight: 203 lb 9.6 oz (92.4 kg)  Height: 5' 6.26" (1.683 m)   BP 118/78 (BP Location: Right Arm, Patient Position: Sitting, Cuff Size: Normal)   Pulse 90   Ht 5' 6.26" (1.683 m)   Wt 203 lb 9.6 oz (92.4 kg)   BMI 32.60 kg/m  Body mass index: body mass index is 32.6 kg/m. Blood pressure percentiles are not available for patients who are 18 years or older.   Hearing Screening   Method: Audiometry   125Hz  250Hz  500Hz   1000Hz  2000Hz  3000Hz  4000Hz  6000Hz  8000Hz   Right ear:   20 20 20  20     Left ear:   20 20 20  20       Visual Acuity Screening   Right eye Left eye Both eyes  Without correction: 20/20 20/20 20/20   With correction:       General Appearance:   alert, oriented, no acute distress  HENT: Normocephalic, no obvious abnormality, conjunctiva clear  Mouth:   Normal appearing teeth, no obvious discoloration, dental caries, or dental caps  Neck:   Neck appears full with palpable thyroid gland, no nodules.  Chest Tanner 4  Lungs:   Clear to auscultation  bilaterally, normal work of breathing  Heart:   Regular rate and rhythm, S1 and S2 normal, no murmurs;   Abdomen:   Soft, non-tender, no mass, or organomegaly  GU normal female external genitalia, pelvic not performed  Musculoskeletal:   Tone and strength strong and symmetrical, all extremities               Lymphatic:   No cervical adenopathy  Skin/Hair/Nails:   Multiple tattoos on arms, chest & right post-auricular region.   Neurologic:   Strength, gait, and coordination normal and age-appropriate     Assessment and Plan:   18 year old female for well adolescent visit Obesity  Congratulated patient on making healthy lifestyle choices such as eating more fruits and vegetables and exercising frequently.  Encouraged her to continue with making healthy choices and not skipping meals or going on a diet.  Encouraged daily exercise for 30 to 60 minutes. Counseled regarding 5-2-1-0 goals of healthy active living including:  - eating at least 5 fruits and vegetables a day - at least 1 hour of activity - no sugary beverages - eating three meals each day with age-appropriate servings - age-appropriate screen time - age-appropriate sleep patterns    Hearing screening result:normal Vision screening result: normal   Orders Placed This Encounter  Procedures  . C. trachomatis/N. gonorrhoeae RNA  . CBC with Differential/Platelet  . Comprehensive metabolic panel  . Hemoglobin A1c  . TSH  . T4, free  . VITAMIN D 25 Hydroxy (Vit-D Deficiency, Fractures)  . Lipid panel  . POCT Rapid HIV  . PPD   PPD placed today.  Return in 2 days for read.  Return in 2 days (on 11/05/2018) for PPD READ.Marland Kitchen  Advised patient to transition to Adult medicine. List provided.  Gina File, MD

## 2018-11-04 LAB — COMPREHENSIVE METABOLIC PANEL
AG Ratio: 1.3 (calc) (ref 1.0–2.5)
ALKALINE PHOSPHATASE (APISO): 78 U/L (ref 36–128)
ALT: 63 U/L — ABNORMAL HIGH (ref 5–32)
AST: 36 U/L — AB (ref 12–32)
Albumin: 4.4 g/dL (ref 3.6–5.1)
BUN: 10 mg/dL (ref 7–20)
CO2: 29 mmol/L (ref 20–32)
CREATININE: 0.81 mg/dL (ref 0.50–1.00)
Calcium: 9.8 mg/dL (ref 8.9–10.4)
Chloride: 103 mmol/L (ref 98–110)
GLUCOSE: 87 mg/dL (ref 65–99)
Globulin: 3.5 g/dL (calc) (ref 2.0–3.8)
Potassium: 4.2 mmol/L (ref 3.8–5.1)
Sodium: 138 mmol/L (ref 135–146)
Total Bilirubin: 0.4 mg/dL (ref 0.2–1.1)
Total Protein: 7.9 g/dL (ref 6.3–8.2)

## 2018-11-04 LAB — CBC WITH DIFFERENTIAL/PLATELET
Absolute Monocytes: 398 cells/uL (ref 200–950)
BASOS ABS: 39 {cells}/uL (ref 0–200)
Basophils Relative: 0.7 %
EOS ABS: 28 {cells}/uL (ref 15–500)
EOS PCT: 0.5 %
HCT: 38 % (ref 35.0–45.0)
Hemoglobin: 12.8 g/dL (ref 11.7–15.5)
Lymphs Abs: 1574 cells/uL (ref 850–3900)
MCH: 27.9 pg (ref 27.0–33.0)
MCHC: 33.7 g/dL (ref 32.0–36.0)
MCV: 82.8 fL (ref 80.0–100.0)
MPV: 9.4 fL (ref 7.5–12.5)
Monocytes Relative: 7.1 %
NEUTROS ABS: 3562 {cells}/uL (ref 1500–7800)
Neutrophils Relative %: 63.6 %
Platelets: 399 10*3/uL (ref 140–400)
RBC: 4.59 10*6/uL (ref 3.80–5.10)
RDW: 13.9 % (ref 11.0–15.0)
Total Lymphocyte: 28.1 %
WBC: 5.6 10*3/uL (ref 3.8–10.8)

## 2018-11-04 LAB — C. TRACHOMATIS/N. GONORRHOEAE RNA
C. trachomatis RNA, TMA: NOT DETECTED
N. GONORRHOEAE RNA, TMA: NOT DETECTED

## 2018-11-04 LAB — TSH: TSH: 2.13 mIU/L

## 2018-11-04 LAB — LIPID PANEL
Cholesterol: 144 mg/dL (ref <170)
HDL: 48 mg/dL (ref >45)
LDL Cholesterol (Calc): 82 mg/dL (calc) (ref <110)
Non-HDL Cholesterol (Calc): 96 mg/dL (calc) (ref <120)
Total CHOL/HDL Ratio: 3 (calc) (ref <5.0)
Triglycerides: 49 mg/dL (ref <90)

## 2018-11-04 LAB — VITAMIN D 25 HYDROXY (VIT D DEFICIENCY, FRACTURES): Vit D, 25-Hydroxy: 11 ng/mL — ABNORMAL LOW (ref 30–100)

## 2018-11-04 LAB — HEMOGLOBIN A1C
HEMOGLOBIN A1C: 5.6 %{Hb} (ref <5.7)
MEAN PLASMA GLUCOSE: 114 (calc)
eAG (mmol/L): 6.3 (calc)

## 2018-11-04 LAB — T4, FREE: Free T4: 1.3 ng/dL (ref 0.8–1.4)

## 2018-11-05 ENCOUNTER — Other Ambulatory Visit: Payer: Self-pay

## 2018-11-05 ENCOUNTER — Ambulatory Visit: Payer: Medicaid Other | Admitting: *Deleted

## 2018-11-05 DIAGNOSIS — Z111 Encounter for screening for respiratory tuberculosis: Secondary | ICD-10-CM

## 2018-11-05 LAB — TB SKIN TEST
INDURATION: 0 mm
TB Skin Test: NEGATIVE

## 2018-11-05 NOTE — Progress Notes (Signed)
Here for PPD read only. Results negative. Copy faxed to [facility] per her request.

## 2018-11-08 ENCOUNTER — Other Ambulatory Visit: Payer: Self-pay | Admitting: Pediatrics

## 2018-11-08 DIAGNOSIS — E559 Vitamin D deficiency, unspecified: Secondary | ICD-10-CM

## 2018-11-08 MED ORDER — VITAMIN D (ERGOCALCIFEROL) 1.25 MG (50000 UNIT) PO CAPS: 50000.0000 [IU] | ORAL_CAPSULE | ORAL | 0 refills | Status: DC

## 2018-11-08 MED ORDER — VITAMIN D 50 MCG (2000 UT) PO CAPS: 1.0000 | ORAL_CAPSULE | Freq: Every day | ORAL | 3 refills | Status: DC

## 2018-11-09 NOTE — Progress Notes (Signed)
[phone]. "unable to complete call".

## 2018-11-09 NOTE — Progress Notes (Signed)
Same message.

## 2018-11-23 ENCOUNTER — Other Ambulatory Visit: Payer: Self-pay | Admitting: Pediatrics

## 2019-01-21 ENCOUNTER — Other Ambulatory Visit: Payer: Self-pay | Admitting: Pediatrics

## 2019-01-21 NOTE — Telephone Encounter (Signed)
Forwarding to orange pod

## 2019-01-21 NOTE — Telephone Encounter (Signed)
Gina Yang also left message on nurse line requesting new RX for triamcinolone; no pharmacy information provided.

## 2019-02-22 ENCOUNTER — Other Ambulatory Visit: Payer: Self-pay | Admitting: Pediatrics

## 2019-02-22 ENCOUNTER — Telehealth: Payer: Self-pay | Admitting: Pediatrics

## 2019-02-22 NOTE — Telephone Encounter (Signed)
[facility] called and would like a med refill on the triamcinolone ointment.

## 2019-05-21 ENCOUNTER — Other Ambulatory Visit: Payer: Self-pay | Admitting: Pediatrics

## 2019-06-06 ENCOUNTER — Other Ambulatory Visit: Payer: Self-pay

## 2019-06-06 ENCOUNTER — Encounter: Payer: Self-pay | Admitting: Pediatrics

## 2019-06-06 ENCOUNTER — Ambulatory Visit: Payer: Medicaid Other | Admitting: Pediatrics

## 2019-06-06 ENCOUNTER — Ambulatory Visit (INDEPENDENT_AMBULATORY_CARE_PROVIDER_SITE_OTHER): Payer: Medicaid Other | Admitting: Pediatrics

## 2019-06-06 VITALS — BP 135/85 | HR 83 | Ht 66.54 in | Wt 194.6 lb

## 2019-06-06 DIAGNOSIS — N898 Other specified noninflammatory disorders of vagina: Secondary | ICD-10-CM

## 2019-06-06 DIAGNOSIS — Z113 Encounter for screening for infections with a predominantly sexual mode of transmission: Secondary | ICD-10-CM

## 2019-06-06 DIAGNOSIS — Z975 Presence of (intrauterine) contraceptive device: Secondary | ICD-10-CM | POA: Diagnosis not present

## 2019-06-06 DIAGNOSIS — R030 Elevated blood-pressure reading, without diagnosis of hypertension: Secondary | ICD-10-CM

## 2019-06-06 DIAGNOSIS — L309 Dermatitis, unspecified: Secondary | ICD-10-CM

## 2019-06-06 MED ORDER — TRIAMCINOLONE ACETONIDE 0.5 % EX OINT: 1.0000 "application " | TOPICAL_OINTMENT | Freq: Two times a day (BID) | CUTANEOUS | 0 refills | Status: DC

## 2019-06-06 NOTE — Progress Notes (Signed)
I provided services for this visit. Screenings completed for STIs, cervical discharge somewhat suspicious for infection but reassuring no CMT or dyspareunia. IUD strings visualized in good position. Patient does check for them from time to time and can feel them intermittently. They wrap around the cervix posteriorly.   Jonathon Resides, FNP

## 2019-06-06 NOTE — Progress Notes (Addendum)
THIS RECORD MAY CONTAIN CONFIDENTIAL INFORMATION THAT SHOULD NOT BE RELEASED WITHOUT REVIEW OF THE SERVICE PROVIDER.  Adolescent Medicine Consultation Follow-Up Visit Gina Yang  is a 18 y.o. female referred by Gina File, MD here today for evaluation of vaginal spotting.    Team Care Documentation:  Gina Yang, MS4 provided team documentation for this visit from [place], [place].  Gina Ramus, FNP-C provided services for this visit.   Plan at last adolescent specialty clinic  visit included STI screening, including HSV PCR.  Pertinent Labs? No  Growth Chart Viewed? Yes    History was provided by the patient.  Interpreter? No   Chief Complaint  Patient presents with  . Follow-up    vaginal spotting   HPI:   PCP Confirmed? Yes    My Chart Activated? Text sent Patient's personal or confidential phone number: N/A   Gina Yang is a 18 y/o female with a PMH of eczema who presents to clinic today for evaluation of vaginal spotting. Vaginal bleeding began on 10/8 and ended on 10/11. She required 3-4 tampons per day during the first 3 days. She had an IUD placed 2 years ago. She typically gets her menstrual period once per month. LMP was 9/14.   Gina Yang states she was last screened for STIs 2 months ago. All results were negative at that time. She started a relationship with a new sexual partner one month ago. She is unaware if he has been screened for STIs. Endorses yellow vaginal discharge. Denies vaginal odor, pruritis, increased urinary urgency or frequency, pelvic pain, pain with sexual intercourse, or fever.   Of note, Gina Yang also notes a new patch of hyperpigmentation on her right flank. Denies any other rashes. Denies recent bug bites or any other precipitating factors.   No LMP recorded. (Menstrual status: IUD). Allergies  Allergen Reactions  . Morphine And Related Itching and Nausea And Vomiting  . Peanut-Containing Drug Products Swelling    Swelling in the throat   . Shrimp [Shellfish Allergy] Swelling    Swelling in the throat   Current Outpatient Medications on Yang Prior to Visit  Medication Sig Dispense Refill  . albuterol (PROAIR HFA) 108 (90 Base) MCG/ACT inhaler Inhale 2 puffs into the lungs every 6 (six) hours as needed for wheezing or shortness of breath. 17 g 0   No current facility-administered medications on Yang prior to visit.    Patient Active Problem List   Diagnosis Date Noted  . Elevated BP without diagnosis of hypertension 06/02/2017  . Obesity 06/02/2017  . IUD (intrauterine device) in place 10/10/2015  . Hx MRSA infection 06/28/2015  . Acne 09/21/2014  . Eczema 12/28/2013  . Mild persistent asthma 12/28/2013  . Allergic rhinitis 12/28/2013   Physical Exam:  Vitals:   06/06/19 1105  BP: 135/85  Pulse: 83  Weight: 194 lb 9.6 oz (88.3 kg)  Height: 5' 6.54" (1.69 m)   BP 135/85   Pulse 83   Ht 5' 6.54" (1.69 m)   Wt 194 lb 9.6 oz (88.3 kg)   BMI 30.91 kg/m  Body mass index: body mass index is 30.91 kg/m. Growth percentile SmartLinks can only be used for patients less than 79 years old.  Physical Exam Constitutional:      General: She is not in acute distress.    Appearance: Normal appearance. She is not ill-appearing.  HENT:     Head: Normocephalic and atraumatic.     Nose: No congestion or rhinorrhea.     Mouth/Throat:  Mouth: Mucous membranes are moist.  Eyes:     General: No scleral icterus.       Right eye: No discharge.        Left eye: No discharge.     Extraocular Movements: Extraocular movements intact.     Conjunctiva/sclera: Conjunctivae normal.  Neck:     Musculoskeletal: Normal range of motion.  Cardiovascular:     Rate and Rhythm: Normal rate and regular rhythm.     Heart sounds: Normal heart sounds. No murmur.  Pulmonary:     Effort: Pulmonary effort is normal. No respiratory distress.     Breath sounds: Normal breath sounds. No stridor. No wheezing, rhonchi or rales.  Abdominal:      General: Bowel sounds are normal. There is no distension.     Palpations: Abdomen is soft. There is no mass.     Tenderness: There is abdominal tenderness. There is no guarding or rebound.     Comments: RUQ tenderness   Genitourinary:    Vagina: Vaginal discharge present.     Comments: Green/yellow discharge. No cervical motion tenderness  Musculoskeletal: Normal range of motion.        General: No swelling.  Skin:    Findings: Rash present.     Comments: Patch of hyperpigmentation noted on right flank. Associated mild dryness  Neurological:     General: No focal deficit present.     Mental Status: She is alert and oriented to person, place, and time.    Assessment/Plan:  Gina Yang is a 18 y/o female with a PMH of eczema who presents to clinic today for evaluation of vaginal spotting.   1) Vaginal spotting: LMP 05/09/19. Most likely menstrual period.  - Return precautions given   2) STI screening: Pelvic exam performed. Yellow/green discharge noted. No cervical motion tenderness.  - RPR - HIV antibody  - Wet prep  - C. Trachomatis/N. gonorrheae RNA  - Recommend having her sexual partner screened for STIs   3) IUD (intrauterine device) in place: IUD strings visualized on pelvic exam.  - Return precautions given   4) Skin rash: Hyperpigmented skin rash noted on right flank with associated mild dryness. Most likely eczema given PMH.  - Recommend Kenalog 0.5%  - Plan to touch base regarding effectiveness of Kenalog 0.5% via MyChart   Follow-up: No follow-ups on Yang.   Follow-up based on laboratory results.   Medical decision-making:  >30 minutes spent face to face with patient with more than 50% of appointment spent discussing diagnosis, management, follow-up, and reviewing of vaginal bleeding, STI screening, IUDs, and skin rashes.  Gina Yang, MS4

## 2019-06-06 NOTE — Patient Instructions (Signed)
Labs today. We will send over results.  Steroid cream is at your pharmacy.

## 2019-06-07 ENCOUNTER — Other Ambulatory Visit: Payer: Self-pay | Admitting: Pediatrics

## 2019-06-07 DIAGNOSIS — B9689 Other specified bacterial agents as the cause of diseases classified elsewhere: Secondary | ICD-10-CM

## 2019-06-07 LAB — WET PREP BY MOLECULAR PROBE
Candida species: NOT DETECTED
MICRO NUMBER:: 979331
SPECIMEN QUALITY:: ADEQUATE
Trichomonas vaginosis: NOT DETECTED

## 2019-06-07 LAB — RPR: RPR Ser Ql: NONREACTIVE

## 2019-06-07 LAB — HIV ANTIBODY (ROUTINE TESTING W REFLEX): HIV 1&2 Ab, 4th Generation: NONREACTIVE

## 2019-06-07 LAB — C. TRACHOMATIS/N. GONORRHOEAE RNA
C. trachomatis RNA, TMA: NOT DETECTED
N. gonorrhoeae RNA, TMA: NOT DETECTED

## 2019-06-07 MED ORDER — METRONIDAZOLE 500 MG PO TABS: 500.0000 mg | ORAL_TABLET | Freq: Two times a day (BID) | ORAL | 0 refills | Status: AC

## 2019-06-09 LAB — C. TRACHOMATIS/N. GONORRHOEAE RNA
C. trachomatis RNA, TMA: NOT DETECTED
N. gonorrhoeae RNA, TMA: NOT DETECTED

## 2019-06-13 ENCOUNTER — Other Ambulatory Visit: Payer: Self-pay | Admitting: Pediatrics

## 2019-07-19 ENCOUNTER — Ambulatory Visit: Payer: Medicaid Other | Admitting: Family

## 2019-07-26 ENCOUNTER — Telehealth: Payer: Self-pay

## 2019-07-26 ENCOUNTER — Other Ambulatory Visit: Payer: Self-pay | Admitting: Pediatrics

## 2019-07-26 DIAGNOSIS — L309 Dermatitis, unspecified: Secondary | ICD-10-CM

## 2019-07-26 NOTE — Telephone Encounter (Signed)
Pt called office asking for refill of triamcinolone ointment (KENALOG) 0.5 %. Routing to provider.

## 2019-07-26 NOTE — Telephone Encounter (Signed)
Done from pharmacy request

## 2019-09-09 ENCOUNTER — Ambulatory Visit: Payer: Medicaid Other | Attending: Internal Medicine

## 2019-09-09 DIAGNOSIS — Z20822 Contact with and (suspected) exposure to covid-19: Secondary | ICD-10-CM

## 2019-09-10 LAB — NOVEL CORONAVIRUS, NAA: SARS-CoV-2, NAA: NOT DETECTED

## 2019-09-14 ENCOUNTER — Inpatient Hospital Stay
Admission: RE | Admit: 2019-09-14 | Discharge: 2019-09-14 | Disposition: A | Discharge status: Home / Self Care | Payer: Medicaid Other | Source: Ambulatory Visit

## 2019-09-14 ENCOUNTER — Ambulatory Visit (INDEPENDENT_AMBULATORY_CARE_PROVIDER_SITE_OTHER): Payer: Medicaid Other | Admitting: Family

## 2019-09-14 ENCOUNTER — Other Ambulatory Visit: Payer: Self-pay

## 2019-09-14 DIAGNOSIS — N898 Other specified noninflammatory disorders of vagina: Secondary | ICD-10-CM

## 2019-09-14 DIAGNOSIS — Z975 Presence of (intrauterine) contraceptive device: Secondary | ICD-10-CM | POA: Diagnosis not present

## 2019-09-14 DIAGNOSIS — L309 Dermatitis, unspecified: Secondary | ICD-10-CM

## 2019-09-14 DIAGNOSIS — J4530 Mild persistent asthma, uncomplicated: Secondary | ICD-10-CM

## 2019-09-14 MED ORDER — ALBUTEROL SULFATE HFA 108 (90 BASE) MCG/ACT IN AERS: 2.0000 | INHALATION_SPRAY | Freq: Four times a day (QID) | RESPIRATORY_TRACT | 0 refills | Status: DC | PRN

## 2019-09-14 MED ORDER — TRIAMCINOLONE ACETONIDE 0.5 % EX OINT: TOPICAL_OINTMENT | Freq: Two times a day (BID) | CUTANEOUS | 0 refills | Status: DC

## 2019-09-14 NOTE — Progress Notes (Signed)
This note is not being shared with the patient for the following reason: To respect privacy (The patient or proxy has requested that the information not be shared).  THIS RECORD MAY CONTAIN CONFIDENTIAL INFORMATION THAT SHOULD NOT BE RELEASED WITHOUT REVIEW OF THE SERVICE PROVIDER.  Virtual Follow-Up Visit via Video Note  I connected with Gina Yang   on 09/14/19 at  2:00 PM EST by a video enabled telemedicine application and verified that I am speaking with the correct person using two identifiers.    This patient visit was completed through the use of an audio/video or telephone encounter in the setting of the State of Emergency due to the COVID-19 Pandemic.  I discussed that the purpose of this telehealth visit is to provide medical care while limiting exposure to the novel coronavirus.       I discussed the limitations of evaluation and management by telemedicine and the availability of in person appointments.    The patient expressed understanding and agreed to proceed.   The patient was physically located at home in [place] or a state in which I am permitted to provide care. The patient and/or parent/guardian understood that s/he may incur co-pays and cost sharing, and agreed to the telemedicine visit. The visit was reasonable and appropriate under the circumstances given the patient's presentation at the time.   The patient and/or parent/guardian has been advised of the potential risks and limitations of this mode of treatment (including, but not limited to, the absence of in-person examination) and has agreed to be treated using telemedicine. The patient's/patient's family's questions regarding telemedicine have been answered.    As this visit was completed in an ambulatory virtual setting, the patient and/or parent/guardian has also been advised to contact their provider's office for worsening conditions, and seek emergency medical treatment and/or call 911 if the patient deems  either necessary.    Gina Yang is a 19 y.o. female referred by [name], MD here today for follow-up of dysmenorrhea and STI screenings.    History was provided by the patient.  PCP Confirmed?  yes  My Chart Activated?   yes    Plan from Last Visit:   -not seen since Feb 2020  Chief Complaint: -cramping  -vaginal discharge   History of Present Illness:  -cramps 2 weeks ago  -new partner x 6 months  -no bleeding  -vaginal discharge:  Yellow  -no vaginal lesions -last Friday felt like she cut herself shaving -wants string check  -needs albuterol inhaler; denies overuse or exacerbations   No LMP recorded. (Menstrual status: IUD).  Review of Systems  Constitutional: Negative for chills, fever and malaise/fatigue.  HENT: Negative for sore throat.   Eyes: Negative for blurred vision and double vision.  Respiratory: Negative for cough, shortness of breath and wheezing.   Cardiovascular: Negative for palpitations.  Gastrointestinal: Positive for abdominal pain. Negative for constipation and nausea.  Genitourinary: Negative for dysuria and frequency.  Musculoskeletal: Negative for myalgias.  Skin: Negative for rash.  Neurological: Negative for dizziness and headaches.  Psychiatric/Behavioral: The patient is not nervous/anxious.      Allergies  Allergen Reactions  . Morphine And Related Itching and Nausea And Vomiting  . Peanut-Containing Drug Products Swelling    Swelling in the throat  . Shrimp [Shellfish Allergy] Swelling    Swelling in the throat   Outpatient Medications Prior to Visit  Medication Sig Dispense Refill  . albuterol (PROAIR HFA) 108 (90 Base) MCG/ACT inhaler Inhale 2  puffs into the lungs every 6 (six) hours as needed for wheezing or shortness of breath. 17 g 0  . triamcinolone ointment (KENALOG) 0.5 % APPLY EXTERNALLY TO THE AFFECTED AREA TWICE DAILY 30 g 0   No facility-administered medications prior to visit.     Patient Active  Problem List   Diagnosis Date Noted  . Elevated BP without diagnosis of hypertension 06/02/2017  . Obesity 06/02/2017  . IUD (intrauterine device) in place 10/10/2015  . Hx MRSA infection 06/28/2015  . Acne 09/21/2014  . Eczema 12/28/2013  . Mild persistent asthma 12/28/2013  . Allergic rhinitis 12/28/2013    Past Medical History:  Reviewed and updated?  yes Past Medical History:  Diagnosis Date  . Allergy   . Asthma   . Boil of buttock   . Eczema     Family History: Reviewed and updated? yes Family History  Problem Relation Age of Onset  . Eczema Brother     Visual Observations/Objective:   General Appearance: Well nourished well developed, in no apparent distress.  Eyes: conjunctiva no swelling or erythema ENT/Mouth: No hoarseness, No cough for duration of visit.  Neck: Supple  Respiratory: Respiratory effort normal, normal rate, no retractions or distress.   Cardio: Appears well-perfused, noncyanotic Musculoskeletal: no obvious deformity Skin: visible skin without rashes, ecchymosis, erythema Neuro: Awake and oriented X 3,  Psych:  normal affect, Insight and Judgment appropriate.    Assessment/Plan: 1. Vaginal discharge -return for pelvic, string check, and infection screening  2. IUD (intrauterine device) in place -continue method   3. Asthma, chronic, mild persistent, uncomplicated -refill request completed - albuterol (PROAIR HFA) 108 (90 Base) MCG/ACT inhaler; Inhale 2 puffs into the lungs every 6 (six) hours as needed for wheezing or shortness of breath.  Dispense: 17 g; Refill: 0   I discussed the assessment and treatment plan with the patient and/or parent/guardian.  They were provided an opportunity to ask questions and all were answered.  They agreed with the plan and demonstrated an understanding of the instructions. They were advised to call back or seek an in-person evaluation in the emergency room if the symptoms worsen or if the condition fails  to improve as anticipated.   Follow-up:   Schedule for in-person   Medical decision-making:   I spent 20 minutes on this telehealth visit inclusive of face-to-face video and care coordination time I was located remote in [place] during this encounter.   Parthenia Ames, NP    CC: Ok Edwards, MD, Ok Edwards, MD

## 2019-09-24 ENCOUNTER — Encounter: Payer: Self-pay | Admitting: Family

## 2019-09-25 NOTE — Progress Notes (Signed)
Sent patient a MyChart message to call and schedule appointment for internal exam and swabs.

## 2019-10-27 ENCOUNTER — Telehealth: Payer: Self-pay | Admitting: Pediatrics

## 2019-10-27 NOTE — Telephone Encounter (Signed)
Noted. She can call and make same day appt as needed or she can make a same day virtual visit.

## 2019-10-27 NOTE — Telephone Encounter (Signed)
Patient wants to make a follow up visit with [facility] but she is on scheduling review. I told her that there is openings on the 25th but she would have to call on that day and she said to let [place] know.

## 2019-10-28 ENCOUNTER — Encounter: Payer: Self-pay | Admitting: Pediatrics

## 2019-10-28 ENCOUNTER — Other Ambulatory Visit (HOSPITAL_COMMUNITY)
Admission: RE | Admit: 2019-10-28 | Discharge: 2019-10-28 | Disposition: A | Discharge status: Home / Self Care | Payer: Medicaid Other | Source: Ambulatory Visit | Attending: Pediatrics | Admitting: Pediatrics

## 2019-10-28 ENCOUNTER — Ambulatory Visit (INDEPENDENT_AMBULATORY_CARE_PROVIDER_SITE_OTHER): Payer: Medicaid Other | Admitting: Pediatrics

## 2019-10-28 ENCOUNTER — Other Ambulatory Visit: Payer: Self-pay

## 2019-10-28 VITALS — BP 122/82 | Ht 66.22 in | Wt 184.6 lb

## 2019-10-28 DIAGNOSIS — Z113 Encounter for screening for infections with a predominantly sexual mode of transmission: Secondary | ICD-10-CM

## 2019-10-28 LAB — POCT RAPID HIV: Rapid HIV, POC: NEGATIVE

## 2019-10-28 NOTE — Progress Notes (Signed)
Patient assumed appointment was with NP for IUD check and not with pediatrician for well visit.  Has regular PCP on [address] and expresses this appointment is not needed.  Would like to cancel and reschedule with adolescent specialist.

## 2019-10-31 ENCOUNTER — Ambulatory Visit (INDEPENDENT_AMBULATORY_CARE_PROVIDER_SITE_OTHER): Payer: Medicaid Other | Admitting: Pediatrics

## 2019-10-31 ENCOUNTER — Encounter: Payer: Self-pay | Admitting: Pediatrics

## 2019-10-31 ENCOUNTER — Other Ambulatory Visit: Payer: Self-pay

## 2019-10-31 VITALS — BP 129/77 | HR 90 | Ht 66.73 in | Wt 183.0 lb

## 2019-10-31 DIAGNOSIS — N898 Other specified noninflammatory disorders of vagina: Secondary | ICD-10-CM | POA: Diagnosis not present

## 2019-10-31 DIAGNOSIS — Z30431 Encounter for routine checking of intrauterine contraceptive device: Secondary | ICD-10-CM | POA: Diagnosis not present

## 2019-10-31 DIAGNOSIS — R0789 Other chest pain: Secondary | ICD-10-CM

## 2019-10-31 DIAGNOSIS — Z3202 Encounter for pregnancy test, result negative: Secondary | ICD-10-CM

## 2019-10-31 LAB — POCT URINE PREGNANCY: Preg Test, Ur: NEGATIVE

## 2019-10-31 LAB — URINE CYTOLOGY ANCILLARY ONLY
Chlamydia: NEGATIVE
Comment: NEGATIVE
Comment: NORMAL
Neisseria Gonorrhea: NEGATIVE

## 2019-10-31 NOTE — Patient Instructions (Signed)
Your IUD strings are in place Your IUD is good til February 2022, after which time it should be replaced or removed.  We will call you with abnormal results. They should be in My Chart in the next couple of days.

## 2019-10-31 NOTE — Progress Notes (Addendum)
THIS RECORD MAY CONTAIN CONFIDENTIAL INFORMATION THAT SHOULD NOT BE RELEASED WITHOUT REVIEW OF THE SERVICE PROVIDER.  Adolescent Medicine Consultation Follow-Up Visit Gina Yang  is a 19 y.o. female referred by [name], MD here today for follow-up regarding IUD check and concerns for vaginal discharge. .    Plan at last adolescent specialty clinic visit included concern for string check and yellow vaginal discharge. This was a video visit. She has not come to an appointment since then for evaluation.   Pertinent Labs? Yes - last HIV and RPR were in 05/2020. Both were NR at the time.  Growth Chart Viewed? yes   History was provided by the patient.  Interpreter? no  Chief complaint: IUD check  HPI:   PCP Confirmed?  yes  My Chart Activated?   yes   Patient presenting today for IUD check. Patient reports that she has some clear-white-yellow vaginal discharge that is similar to prior discharge. It comes every 2 days or so, though didn't come this frequently in the past. This has been going on for a couple of months. No pain or itching. No bleeding. She has no periods. 1 female sexual partner for about 6 months.  Don't usually use condoms. No pain with sex. No fever, cough, congestion, rhinorrhea, vomiting, diarrhea, rash. She has had some bilateral chest pain on the lateral aspects of her breasts that is not changed by movement. Has been going on for a few days. Noticed it after sleeping in bra one night. No recent breast size changes or tightened bra straps. No nipple discharge or bleeding. Patient concerned because breast cancer runs in the family. .   On chart review, the IUD was placed in Feb 2017  No LMP recorded. (Menstrual status: IUD). Allergies  Allergen Reactions  . Morphine And Related Itching and Nausea And Vomiting  . Peanut-Containing Drug Products Swelling    Swelling in the throat  . Shrimp [Shellfish Allergy] Swelling    Swelling in the throat   Current  Outpatient Medications on File Prior to Visit  Medication Sig Dispense Refill  . albuterol (PROAIR HFA) 108 (90 Base) MCG/ACT inhaler Inhale 2 puffs into the lungs every 6 (six) hours as needed for wheezing or shortness of breath. 17 g 0  . triamcinolone ointment (KENALOG) 0.5 % Apply topically 2 (two) times daily. (Patient not taking: Reported on 10/31/2019) 30 g 0   No current facility-administered medications on file prior to visit.    Patient Active Problem List   Diagnosis Date Noted  . Elevated BP without diagnosis of hypertension 06/02/2017  . Obesity 06/02/2017  . IUD (intrauterine device) in place 10/10/2015  . Hx MRSA infection 06/28/2015  . Acne 09/21/2014  . Eczema 12/28/2013  . Mild persistent asthma 12/28/2013  . Allergic rhinitis 12/28/2013   Activities:  Special interests/hobbies/sports: works in [employer]  The following portions of the patient's history were reviewed and updated as appropriate: allergies, current medications, past family history, past medical history, past social history, past surgical history and problem list.  Physical Exam:  Vitals:   10/31/19 1406  BP: 129/77  Pulse: 90  Weight: 183 lb (83 kg)  Height: 5' 6.73" (1.695 m)   BP 129/77   Pulse 90   Ht 5' 6.73" (1.695 m)   Wt 183 lb (83 kg)   BMI 28.89 kg/m  Body mass index: body mass index is 28.89 kg/m. Growth percentile SmartLinks can only be used for patients less than 59 years old.  Physical Exam  Constitutional: She is oriented to person, place, and time. She appears well-developed and well-nourished. No distress.  HENT:  Head: Normocephalic and atraumatic.  Right Ear: External ear normal.  Left Ear: External ear normal.  Mouth/Throat: Oropharynx is clear and moist.  Eyes: Pupils are equal, round, and reactive to light. Conjunctivae and EOM are normal. Right eye exhibits no discharge. Left eye exhibits no discharge.  Cardiovascular: Normal rate, regular rhythm, normal heart  sounds and intact distal pulses.  No murmur heard. Pulmonary/Chest: Effort normal and breath sounds normal. She has no wheezes. She has no rales. No breast swelling, discharge or bleeding.  With tenderness to palpation of the lateral pec major muscles with palpable trigger point/knot on R side. No breast lumps or nodules. Nipples pierced bilaterally with bars.   Abdominal: Soft. Bowel sounds are normal. She exhibits no distension and no mass. There is no abdominal tenderness.  Genitourinary: No breast swelling, discharge or bleeding. There is no rash, tenderness, lesion or injury on the right labia. There is no rash or tenderness on the left labia. Cervix exhibits no motion tenderness and no friability. Right adnexum displays no mass, no tenderness and no fullness. Left adnexum displays no mass, no tenderness and no fullness.    Vaginal discharge and tenderness present.  There is tenderness in the vagina.    No signs of injury in the vagina.     Genitourinary Comments: With white/yellow discharge pooled in the vagina. Non actively draining from cervix, which is normal in appearance. Some tenderness to palpation of the R vaginal fornix without specific adnexal tenderness. Cervix deviates to left and is anterior.    Musculoskeletal:        General: No deformity. Normal range of motion.     Cervical back: Normal range of motion and neck supple.  Lymphadenopathy:    She has no cervical adenopathy.  Neurological: She is alert and oriented to person, place, and time. She has normal reflexes.  Skin: Skin is warm and dry. No rash noted. No pallor.  Psychiatric: She has a normal mood and affect. Her behavior is normal.  Nursing note and vitals reviewed.   Assessment/Plan: Michaella Imai is a 19 y.o. female who presents for vaginal discharge and an IUD string check. Exam not concerning for vaginitis. With R sided vaginal fornix pain that is not consistent with adnexal tenderness on exam; suspect due to  mild trauma that should get better with time. Discharge may be related to BV -- will send wet prep and see if she needs flagyl. Chest/breast pain is actually tenderness of the pectoralis muscle on exam; no concerning breast lumps or nodules.   1. Vaginal discharge - with white/yellow vaginal discharge - no evidence of cervicitis or vaginitis on exam - GC/C negative a couple of days ago; will send swab as this is more accurate.  - f/u wet prep and possible need for flagyl - avoid aggressive vaginal hygiene - counseled on safe sex - C. trachomatis/N. gonorrhoeae RNA - HIV Antibody (routine testing w rflx) - RPR - WET PREP BY MOLECULAR PROBE  2. Pregnancy examination or test, negative result - negative - POCT urine pregnancy  3. IUD check up - strings in place  4. Chest wall tenderness - no breast abnormalities - pec muscle tenderness on exam - trial warm compresses, NSAIDs   BH screenings: not performed today  Follow-up:  Return if symptoms worsen or fail to improve.   Medical decision-making:  >15 minutes  spent face to face with patient with more than 50% of appointment spent discussing diagnosis, management, follow-up, and reviewing of prior records.  Renee Rival, MD

## 2019-11-01 ENCOUNTER — Other Ambulatory Visit: Payer: Self-pay | Admitting: Pediatrics

## 2019-11-01 LAB — WET PREP BY MOLECULAR PROBE
Candida species: DETECTED — AB
MICRO NUMBER:: 10227362
SPECIMEN QUALITY:: ADEQUATE
Trichomonas vaginosis: NOT DETECTED

## 2019-11-01 LAB — C. TRACHOMATIS/N. GONORRHOEAE RNA
C. trachomatis RNA, TMA: NOT DETECTED
N. gonorrhoeae RNA, TMA: NOT DETECTED

## 2019-11-01 LAB — RPR: RPR Ser Ql: NONREACTIVE

## 2019-11-01 LAB — HIV ANTIBODY (ROUTINE TESTING W REFLEX): HIV 1&2 Ab, 4th Generation: NONREACTIVE

## 2019-11-01 MED ORDER — METRONIDAZOLE 500 MG PO TABS: 500.0000 mg | ORAL_TABLET | Freq: Two times a day (BID) | ORAL | 0 refills | Status: AC

## 2019-11-01 MED ORDER — FLUCONAZOLE 150 MG PO TABS: ORAL_TABLET | ORAL | 0 refills | Status: DC

## 2019-11-03 NOTE — Progress Notes (Signed)
I have reviewed the resident's note and plan of care and helped develop the plan as necessary.  I was present for a supervised pelvic exam. Suspect she could have yeast or BV so will swab and treat if needed.   Alfonso Ramus, FNP

## 2020-01-02 ENCOUNTER — Encounter (HOSPITAL_COMMUNITY): Payer: Self-pay

## 2020-01-02 ENCOUNTER — Other Ambulatory Visit: Payer: Self-pay

## 2020-01-02 ENCOUNTER — Emergency Department (HOSPITAL_COMMUNITY)
Admission: EM | Admit: 2020-01-02 | Discharge: 2020-01-02 | Disposition: A | Discharge status: Home / Self Care | Payer: BC Managed Care – PPO | Attending: Emergency Medicine | Admitting: Emergency Medicine

## 2020-01-02 DIAGNOSIS — R519 Headache, unspecified: Secondary | ICD-10-CM | POA: Insufficient documentation

## 2020-01-02 DIAGNOSIS — Z79899 Other long term (current) drug therapy: Secondary | ICD-10-CM | POA: Insufficient documentation

## 2020-01-02 DIAGNOSIS — J45909 Unspecified asthma, uncomplicated: Secondary | ICD-10-CM | POA: Diagnosis not present

## 2020-01-02 DIAGNOSIS — J069 Acute upper respiratory infection, unspecified: Secondary | ICD-10-CM | POA: Diagnosis not present

## 2020-01-02 DIAGNOSIS — Z20822 Contact with and (suspected) exposure to covid-19: Secondary | ICD-10-CM | POA: Diagnosis present

## 2020-01-02 DIAGNOSIS — R070 Pain in throat: Secondary | ICD-10-CM | POA: Insufficient documentation

## 2020-01-02 DIAGNOSIS — F17290 Nicotine dependence, other tobacco product, uncomplicated: Secondary | ICD-10-CM | POA: Diagnosis not present

## 2020-01-02 LAB — SARS CORONAVIRUS 2 BY RT PCR (HOSPITAL ORDER, PERFORMED IN ~~LOC~~ HOSPITAL LAB): SARS Coronavirus 2: NEGATIVE

## 2020-01-02 LAB — GROUP A STREP BY PCR: Group A Strep by PCR: NOT DETECTED

## 2020-01-02 MED ORDER — ACETAMINOPHEN 325 MG PO TABS
650.0000 mg | ORAL_TABLET | Freq: Once | ORAL | Status: AC
  Administered 2020-01-02: 650 mg via ORAL
  Filled 2020-01-02: qty 2

## 2020-01-02 NOTE — ED Notes (Signed)
Called 3X for room call. Eloped from waiting area.  

## 2020-01-02 NOTE — ED Provider Notes (Signed)
[facility]-EMERGENCY DEPT Provider Note   CSN: [id number] Arrival date & time: 01/02/20  2131     History Chief Complaint  Patient presents with  . Covid Exposure    Gina Yang is a 19 y.o. female.  19 yo female with headache, sore throat, body aches after exposure to her brother who she lives with who tested positive for COVID. Patient's symptoms started 3 days ago. No significant past medical history. Non smoker, no other complaints or concerns.         Past Medical History:  Diagnosis Date  . Allergy   . Asthma   . Boil of buttock   . Eczema     Patient Active Problem List   Diagnosis Date Noted  . Elevated BP without diagnosis of hypertension 06/02/2017  . Obesity 06/02/2017  . IUD (intrauterine device) in place 10/10/2015  . Hx MRSA infection 06/28/2015  . Acne 09/21/2014  . Eczema 12/28/2013  . Mild persistent asthma 12/28/2013  . Allergic rhinitis 12/28/2013    Past Surgical History:  Procedure Laterality Date  . NO PAST SURGERIES       OB History    Gravida  0   Para      Term      Preterm      AB      Living        SAB      TAB      Ectopic      Multiple      Live Births              Family History  Problem Relation Age of Onset  . Eczema Brother     Social History   Tobacco Use  . Smoking status: Current Every Day Smoker    Types: Cigars  . Smokeless tobacco: Never Used  . Tobacco comment: mom smokes  Substance Use Topics  . Alcohol use: Yes    Alcohol/week: 0.0 standard drinks    Comment: very little   . Drug use: No    Home Medications Prior to Admission medications   Medication Sig Start Date End Date Taking? Authorizing Provider  albuterol (PROAIR HFA) 108 (90 Base) MCG/ACT inhaler Inhale 2 puffs into the lungs every 6 (six) hours as needed for wheezing or shortness of breath. 09/14/19   Georges Mouse, NP  fluconazole (DIFLUCAN) 150 MG tablet Take 1 tablet today and 1 tablet 3  days from now 11/01/19   Alfonso Ramus T, FNP  triamcinolone ointment (KENALOG) 0.5 % Apply topically 2 (two) times daily. Patient not taking: Reported on 10/31/2019 09/14/19   Alfonso Ramus T, FNP    Allergies    Morphine and related, Peanut-containing drug products, and Shrimp [shellfish allergy]  Review of Systems   Review of Systems  Constitutional: Negative for chills, diaphoresis and fever.  HENT: Positive for sore throat. Negative for congestion.   Respiratory: Positive for cough and shortness of breath. Negative for choking.   Cardiovascular: Negative for chest pain.  Gastrointestinal: Negative for diarrhea, nausea and vomiting.  Musculoskeletal: Positive for myalgias. Negative for arthralgias.  Skin: Negative for rash and wound.  Neurological: Positive for headaches.  Hematological: Negative for adenopathy.  Psychiatric/Behavioral: Negative for confusion.  All other systems reviewed and are negative.   Physical Exam Updated Vital Signs BP 134/87 (BP Location: Left Arm)   Pulse 84   Temp 98.2 F (36.8 C) (Oral)   Resp 18   Ht 5\' 6"  (1.676  m)   Wt 80.3 kg   SpO2 100%   BMI 28.57 kg/m   Physical Exam Vitals and nursing note reviewed.  Constitutional:      General: She is not in acute distress.    Appearance: She is well-developed. She is not diaphoretic.  HENT:     Head: Normocephalic and atraumatic.     Nose: Nose normal.     Mouth/Throat:     Mouth: Mucous membranes are moist.     Pharynx: No oropharyngeal exudate or posterior oropharyngeal erythema.  Eyes:     Conjunctiva/sclera: Conjunctivae normal.  Cardiovascular:     Rate and Rhythm: Normal rate and regular rhythm.     Pulses: Normal pulses.     Heart sounds: Normal heart sounds.  Pulmonary:     Effort: Pulmonary effort is normal.     Breath sounds: Normal breath sounds.  Musculoskeletal:     Cervical back: Normal range of motion and neck supple. No rigidity or tenderness.  Skin:    General:  Skin is warm and dry.     Findings: No erythema or rash.  Neurological:     Mental Status: She is alert and oriented to person, place, and time.  Psychiatric:        Behavior: Behavior normal.     ED Results / Procedures / Treatments   Labs (all labs ordered are listed, but only abnormal results are displayed) Labs Reviewed  SARS CORONAVIRUS 2 BY RT PCR (HOSPITAL ORDER, [place] [facility])  GROUP A STREP BY PCR    EKG None  Radiology No results found.  Procedures Procedures (including critical care time)  Medications Ordered in ED Medications  acetaminophen (TYLENOL) tablet 650 mg (650 mg Oral Given 01/02/20 2331)    ED Course  I have reviewed the triage vital signs and the nursing notes.  Pertinent labs & imaging results that were available during my care of the patient were reviewed by me and considered in my medical decision making (see chart for details).  Clinical Course as of Jan 02 2339  Mon May 10, 613  5362 19 year old female with 3 days of URI symptoms after exposure to her brother who she lives with and has tested positive for COVID-19.  On exam, patient is well-appearing, vitals are reassuring, lungs are clear, throat is normal in appearance.  Patient will be given Tylenol for her headache, discharged and advised to quarantine.  Patient's rapid strep test is negative Covid test is negative as well.   [LM]  2339 Gina Yang was evaluated in Emergency Department on 01/02/2020 for the symptoms described in the history of present illness. She was evaluated in the context of the global COVID-19 pandemic, which necessitated consideration that the patient might be at risk for infection with the SARS-CoV-2 virus that causes COVID-19. Institutional protocols and algorithms that pertain to the evaluation of patients at risk for COVID-19 are in a state of rapid change based on information released by regulatory bodies including the CDC and federal and  state organizations. These policies and algorithms were followed during the patient's care in the ED.     [LM]    Clinical Course User Index [LM] Roque Lias   MDM Rules/Calculators/A&P                      Final Clinical Impression(s) / ED Diagnoses Final diagnoses:  Viral URI    Rx / DC Orders ED Discharge Orders  None       Jeannie Fend, PA-C 01/02/20 2340    Melene Plan, DO 01/02/20 2356

## 2020-01-02 NOTE — ED Triage Notes (Signed)
Covid exposure by family member for 3 days. C/o ha, shob, sore throat and generalized body aches

## 2020-01-16 ENCOUNTER — Other Ambulatory Visit: Payer: Self-pay

## 2020-01-16 ENCOUNTER — Telehealth: Payer: Self-pay | Admitting: Pediatrics

## 2020-01-16 ENCOUNTER — Other Ambulatory Visit (HOSPITAL_COMMUNITY)
Admission: RE | Admit: 2020-01-16 | Discharge: 2020-01-16 | Disposition: A | Discharge status: Home / Self Care | Payer: Medicaid Other | Source: Ambulatory Visit | Attending: Pediatrics | Admitting: Pediatrics

## 2020-01-16 ENCOUNTER — Encounter: Payer: Self-pay | Admitting: Pediatrics

## 2020-01-16 ENCOUNTER — Ambulatory Visit (INDEPENDENT_AMBULATORY_CARE_PROVIDER_SITE_OTHER): Payer: Medicaid Other | Admitting: Pediatrics

## 2020-01-16 VITALS — BP 133/79 | HR 83 | Wt 180.0 lb

## 2020-01-16 DIAGNOSIS — N946 Dysmenorrhea, unspecified: Secondary | ICD-10-CM

## 2020-01-16 DIAGNOSIS — E559 Vitamin D deficiency, unspecified: Secondary | ICD-10-CM | POA: Diagnosis not present

## 2020-01-16 DIAGNOSIS — F4322 Adjustment disorder with anxiety: Secondary | ICD-10-CM

## 2020-01-16 DIAGNOSIS — Z113 Encounter for screening for infections with a predominantly sexual mode of transmission: Secondary | ICD-10-CM | POA: Diagnosis not present

## 2020-01-16 MED ORDER — IBUPROFEN 800 MG PO TABS: 800.0000 mg | ORAL_TABLET | Freq: Three times a day (TID) | ORAL | 0 refills | Status: DC | PRN

## 2020-01-16 MED ORDER — IBUPROFEN 200 MG PO TABS
800.0000 mg | ORAL_TABLET | Freq: Once | ORAL | Status: AC
  Administered 2020-01-16: 800 mg via ORAL

## 2020-01-16 MED ORDER — FLUOXETINE HCL 20 MG PO CAPS: 20.0000 mg | ORAL_CAPSULE | Freq: Every day | ORAL | 3 refills | Status: DC

## 2020-01-16 NOTE — Telephone Encounter (Signed)
School/work note written and sent to patient via MyChart.

## 2020-01-16 NOTE — Telephone Encounter (Signed)
Patient called to ask for work note for today and tomorrow, returning to work on Wednesday 01/16/20.

## 2020-01-16 NOTE — Progress Notes (Signed)
History was provided by the patient.  Gina Yang is a 19 y.o. female who is here for vaginal discharge and itching, anxiety.  [name], MD   HPI:  Pt reports that she is on her period and she is having really bad cramping which is different from her normal. She has been using condoms so she hopes it is nothing else. She is taking tylenol. Before her period she had what she thought was a yeast infection. No pain with sex.   Says that anxiety is acting up- hasn't had an anxiety attack in a year. She is stressed with work. 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 For her anxiety she has been doing some research about medications. She has never taken medication for anxiety. Mom and dad take medication for anxiety. Not sure what. Having loss of appetite. Been laying around and not wanting to get out much.   If she is alone it is hard for her to sleep.   She uses MJ- doesn't use that much. No ETOH. No vape.   PHQ-SADS Last 3 Score only 01/16/2020  PHQ-15 Score 3  Total GAD-7 Score 5  PHQ-9 Total Score 5     No LMP recorded. (Menstrual status: IUD).  Review of Systems  Constitutional: Negative for malaise/fatigue.  Eyes: Negative for double vision.  Respiratory: Negative for shortness of breath.   Cardiovascular: Negative for chest pain and palpitations.  Gastrointestinal: Negative for abdominal pain, constipation, diarrhea, nausea and vomiting.  Genitourinary: Negative for dysuria.  Musculoskeletal: Negative for joint pain and myalgias.  Skin: Negative for rash.  Neurological: Negative for dizziness and headaches.  Endo/Heme/Allergies: Does not bruise/bleed easily.    Patient Active Problem List   Diagnosis Date Noted  . Elevated BP without diagnosis of hypertension 06/02/2017  . Obesity 06/02/2017  . IUD (intrauterine device) in place 10/10/2015  . Hx MRSA infection 06/28/2015  . Acne 09/21/2014  . Eczema 12/28/2013  . Mild persistent asthma 12/28/2013   . Allergic rhinitis 12/28/2013    Current Outpatient Medications on File Prior to Visit  Medication Sig Dispense Refill  . albuterol (PROAIR HFA) 108 (90 Base) MCG/ACT inhaler Inhale 2 puffs into the lungs every 6 (six) hours as needed for wheezing or shortness of breath. 17 g 0  . fluconazole (DIFLUCAN) 150 MG tablet Take 1 tablet today and 1 tablet 3 days from now (Patient not taking: Reported on 01/16/2020) 2 tablet 0  . triamcinolone ointment (KENALOG) 0.5 % Apply topically 2 (two) times daily. (Patient not taking: Reported on 10/31/2019) 30 g 0   No current facility-administered medications on file prior to visit.    Allergies  Allergen Reactions  . Morphine And Related Itching and Nausea And Vomiting  . Peanut-Containing Drug Products Swelling    Swelling in the throat  . Shrimp [Shellfish Allergy] Swelling    Swelling in the throat     Physical Exam:    Vitals:   01/16/20 1045  BP: 133/79  Pulse: 83  Weight: 180 lb (81.6 kg)    Growth percentile SmartLinks can only be used for patients less than 80 years old.  Physical Exam Vitals and nursing note reviewed.  Constitutional:      General: She is not in acute distress.    Appearance: She is well-developed.  Neck:     Thyroid: No thyromegaly.  Cardiovascular:     Rate and Rhythm: Normal rate and regular rhythm.     Heart sounds: No murmur.  Pulmonary:     Breath sounds: Normal breath sounds.  Abdominal:     Palpations: Abdomen is soft. There is no mass.     Tenderness: There is no abdominal tenderness. There is no guarding.  Musculoskeletal:     Right lower leg: No edema.     Left lower leg: No edema.  Lymphadenopathy:     Cervical: No cervical adenopathy.  Skin:    General: Skin is warm.     Capillary Refill: Capillary refill takes less than 2 seconds.     Findings: No rash.  Neurological:     General: No focal deficit present.     Mental Status: She is alert.     Comments: No tremor  Psychiatric:         Mood and Affect: Mood normal.        Behavior: Behavior normal.     Assessment/Plan: 1. Dysmenorrhea Screened for infection today. Will use ibuprofen instead of tylenol- one dose given in clinic today.  - ibuprofen (ADVIL) 800 MG tablet; Take 1 tablet (800 mg total) by mouth every 8 (eight) hours as needed.  Dispense: 30 tablet; Refill: 0  2. Vitamin D deficiency Last screen 11. Will check again today r/t mental health.  - VITAMIN D 25 Hydroxy (Vit-D Deficiency, Fractures)  3. Adjustment disorder with anxiety Labs today, start fluoxetine and referred to counseling.  - Comprehensive metabolic panel - TSH + free T4 - VITAMIN D 25 Hydroxy (Vit-D Deficiency, Fractures) - FLUoxetine (PROZAC) 20 MG capsule; Take 1 capsule (20 mg total) by mouth daily.  Dispense: 30 capsule; Refill: 3 - Ambulatory referral to Behavioral Health  4. Routine screening for STI (sexually transmitted infection) Per protocol- new partner.  - Urine cytology ancillary only - WET PREP BY MOLECULAR PROBE - HIV Antibody (routine testing w rflx) - RPR   Jonathon Resides, FNP

## 2020-01-16 NOTE — Patient Instructions (Addendum)
Next ibuprofen dose at 7pm. Can take tylenol before then if needed   Fluoxetine capsules or tablets (Depression/Mood Disorders) What is this medicine? FLUOXETINE (floo OX e teen) belongs to a class of drugs known as selective serotonin reuptake inhibitors (SSRIs). It helps to treat mood problems such as depression, obsessive compulsive disorder, and panic attacks. It can also treat certain eating disorders. This medicine may be used for other purposes; ask your health care provider or pharmacist if you have questions. COMMON BRAND NAME(S): Prozac What should I tell my health care provider before I take this medicine? They need to know if you have any of these conditions:  bipolar disorder or a family history of bipolar disorder  bleeding disorders  glaucoma  heart disease  liver disease  low levels of sodium in the blood  seizures  suicidal thoughts, plans, or attempt; a previous suicide attempt by you or a family member  take MAOIs like Carbex, Eldepryl, Marplan, Nardil, and Parnate  take medicines that treat or prevent blood clots  thyroid disease  an unusual or allergic reaction to fluoxetine, other medicines, foods, dyes, or preservatives  pregnant or trying to get pregnant  breast-feeding How should I use this medicine? Take this medicine by mouth with a glass of water. Follow the directions on the prescription label. You can take this medicine with or without food. Take your medicine at regular intervals. Do not take it more often than directed. Do not stop taking this medicine suddenly except upon the advice of your doctor. Stopping this medicine too quickly may cause serious side effects or your condition may worsen. A special MedGuide will be given to you by the pharmacist with each prescription and refill. Be sure to read this information carefully each time. Talk to your pediatrician regarding the use of this medicine in children. While this drug may be prescribed  for children as young as 7 years for selected conditions, precautions do apply. Overdosage: If you think you have taken too much of this medicine contact a poison control center or emergency room at once. NOTE: This medicine is only for you. Do not share this medicine with others. What if I miss a dose? If you miss a dose, skip the missed dose and go back to your regular dosing schedule. Do not take double or extra doses. What may interact with this medicine? Do not take this medicine with any of the following medications:  other medicines containing fluoxetine, like Sarafem or Symbyax  cisapride  dronedarone  linezolid  MAOIs like Carbex, Eldepryl, Marplan, Nardil, and Parnate  methylene blue (injected into a vein)  pimozide  thioridazine This medicine may also interact with the following medications:  alcohol  amphetamines  aspirin and aspirin-like medicines  carbamazepine  certain medicines for depression, anxiety, or psychotic disturbances  certain medicines for migraine headaches like almotriptan, eletriptan, frovatriptan, naratriptan, rizatriptan, sumatriptan, zolmitriptan  digoxin  diuretics  fentanyl  flecainide  furazolidone  isoniazid  lithium  medicines for sleep  medicines that treat or prevent blood clots like warfarin, enoxaparin, and dalteparin  NSAIDs, medicines for pain and inflammation, like ibuprofen or naproxen  other medicines that prolong the QT interval (an abnormal heart rhythm)  phenytoin  procarbazine  propafenone  rasagiline  ritonavir  supplements like St. John's wort, kava kava, valerian  tramadol  tryptophan  vinblastine This list may not describe all possible interactions. Give your health care provider a list of all the medicines, herbs, non-prescription drugs, or dietary supplements  you use. Also tell them if you smoke, drink alcohol, or use illegal drugs. Some items may interact with your medicine. What  should I watch for while using this medicine? Tell your doctor if your symptoms do not get better or if they get worse. Visit your doctor or health care professional for regular checks on your progress. Because it may take several weeks to see the full effects of this medicine, it is important to continue your treatment as prescribed by your doctor. Patients and their families should watch out for new or worsening thoughts of suicide or depression. Also watch out for sudden changes in feelings such as feeling anxious, agitated, panicky, irritable, hostile, aggressive, impulsive, severely restless, overly excited and hyperactive, or not being able to sleep. If this happens, especially at the beginning of treatment or after a change in dose, call your health care professional. Dennis Bast may get drowsy or dizzy. Do not drive, use machinery, or do anything that needs mental alertness until you know how this medicine affects you. Do not stand or sit up quickly, especially if you are an older patient. This reduces the risk of dizzy or fainting spells. Alcohol may interfere with the effect of this medicine. Avoid alcoholic drinks. Your mouth may get dry. Chewing sugarless gum or sucking hard candy, and drinking plenty of water may help. Contact your doctor if the problem does not go away or is severe. This medicine may affect blood sugar levels. If you have diabetes, check with your doctor or health care professional before you change your diet or the dose of your diabetic medicine. What side effects may I notice from receiving this medicine? Side effects that you should report to your doctor or health care professional as soon as possible:  allergic reactions like skin rash, itching or hives, swelling of the face, lips, or tongue  anxious  black, tarry stools  breathing problems  changes in vision  confusion  elevated mood, decreased need for sleep, racing thoughts, impulsive behavior  eye pain  fast,  irregular heartbeat  feeling faint or lightheaded, falls  feeling agitated, angry, or irritable  hallucination, loss of contact with reality  loss of balance or coordination  loss of memory  painful or prolonged erections  restlessness, pacing, inability to keep still  seizures  stiff muscles  suicidal thoughts or other mood changes  trouble sleeping  unusual bleeding or bruising  unusually weak or tired  vomiting Side effects that usually do not require medical attention (report to your doctor or health care professional if they continue or are bothersome):  change in appetite or weight  change in sex drive or performance  diarrhea  dry mouth  headache  increased sweating  nausea  tremors This list may not describe all possible side effects. Call your doctor for medical advice about side effects. You may report side effects to FDA at 1-800-FDA-1088. Where should I keep my medicine? Keep out of the reach of children. Store at room temperature between 15 and 30 degrees C (59 and 86 degrees F). Throw away any unused medicine after the expiration date. NOTE: This sheet is a summary. It may not cover all possible information. If you have questions about this medicine, talk to your doctor, pharmacist, or health care provider.  2020 Elsevier/Gold Standard (2018-04-01 11:56:53)

## 2020-01-17 LAB — COMPREHENSIVE METABOLIC PANEL
AG Ratio: 1.4 (calc) (ref 1.0–2.5)
ALT: 41 U/L — ABNORMAL HIGH (ref 6–29)
AST: 27 U/L (ref 10–30)
Albumin: 4.3 g/dL (ref 3.6–5.1)
Alkaline phosphatase (APISO): 52 U/L (ref 31–125)
BUN: 8 mg/dL (ref 7–25)
CO2: 25 mmol/L (ref 20–32)
Calcium: 9.6 mg/dL (ref 8.6–10.2)
Chloride: 103 mmol/L (ref 98–110)
Creat: 0.78 mg/dL (ref 0.50–1.10)
Globulin: 3.1 g/dL (calc) (ref 1.9–3.7)
Glucose, Bld: 89 mg/dL (ref 65–99)
Potassium: 4.4 mmol/L (ref 3.5–5.3)
Sodium: 136 mmol/L (ref 135–146)
Total Bilirubin: 0.4 mg/dL (ref 0.2–1.2)
Total Protein: 7.4 g/dL (ref 6.1–8.1)

## 2020-01-17 LAB — TSH+FREE T4: TSH W/REFLEX TO FT4: 1.06 mIU/L

## 2020-01-17 LAB — HIV ANTIBODY (ROUTINE TESTING W REFLEX): HIV 1&2 Ab, 4th Generation: NONREACTIVE

## 2020-01-17 LAB — WET PREP BY MOLECULAR PROBE
Candida species: NOT DETECTED
Gardnerella vaginalis: NOT DETECTED
MICRO NUMBER:: 10511755
SPECIMEN QUALITY:: ADEQUATE
Trichomonas vaginosis: NOT DETECTED

## 2020-01-17 LAB — VITAMIN D 25 HYDROXY (VIT D DEFICIENCY, FRACTURES): Vit D, 25-Hydroxy: 27 ng/mL — ABNORMAL LOW (ref 30–100)

## 2020-01-17 LAB — RPR: RPR Ser Ql: NONREACTIVE

## 2020-01-20 LAB — URINE CYTOLOGY ANCILLARY ONLY
Bacterial Vaginitis-Urine: NEGATIVE
Candida Urine: NEGATIVE
Chlamydia: NEGATIVE
Comment: NEGATIVE
Comment: NEGATIVE
Comment: NORMAL
Neisseria Gonorrhea: NEGATIVE
Trichomonas: NEGATIVE

## 2020-01-27 ENCOUNTER — Telehealth: Payer: Medicaid Other | Admitting: Family

## 2020-02-17 ENCOUNTER — Other Ambulatory Visit: Payer: Self-pay | Admitting: Pediatrics

## 2020-02-17 ENCOUNTER — Other Ambulatory Visit: Payer: Self-pay

## 2020-02-17 DIAGNOSIS — N946 Dysmenorrhea, unspecified: Secondary | ICD-10-CM

## 2020-02-20 MED ORDER — IBUPROFEN 800 MG PO TABS: 800.0000 mg | ORAL_TABLET | Freq: Three times a day (TID) | ORAL | 0 refills | Status: DC | PRN

## 2020-02-25 ENCOUNTER — Encounter: Payer: Self-pay | Admitting: Family

## 2020-02-28 ENCOUNTER — Other Ambulatory Visit: Payer: Self-pay | Admitting: Pediatrics

## 2020-02-28 DIAGNOSIS — J4530 Mild persistent asthma, uncomplicated: Secondary | ICD-10-CM

## 2020-02-28 DIAGNOSIS — L309 Dermatitis, unspecified: Secondary | ICD-10-CM

## 2020-02-28 MED ORDER — TRIAMCINOLONE ACETONIDE 0.5 % EX OINT: 1.0000 "application " | TOPICAL_OINTMENT | Freq: Two times a day (BID) | CUTANEOUS | 3 refills | Status: DC

## 2020-04-12 ENCOUNTER — Other Ambulatory Visit: Payer: Self-pay

## 2020-04-12 ENCOUNTER — Ambulatory Visit (INDEPENDENT_AMBULATORY_CARE_PROVIDER_SITE_OTHER): Payer: Medicaid Other | Admitting: Pediatrics

## 2020-04-12 ENCOUNTER — Encounter: Payer: Self-pay | Admitting: Pediatrics

## 2020-04-12 ENCOUNTER — Other Ambulatory Visit (HOSPITAL_COMMUNITY)
Admission: RE | Admit: 2020-04-12 | Discharge: 2020-04-12 | Disposition: A | Discharge status: Home / Self Care | Payer: Medicaid Other | Source: Ambulatory Visit | Attending: Pediatrics | Admitting: Pediatrics

## 2020-04-12 VITALS — BP 137/86 | HR 81 | Ht 66.54 in | Wt 173.6 lb

## 2020-04-12 DIAGNOSIS — Z113 Encounter for screening for infections with a predominantly sexual mode of transmission: Secondary | ICD-10-CM | POA: Diagnosis present

## 2020-04-13 LAB — URINE CYTOLOGY ANCILLARY ONLY
Chlamydia: NEGATIVE
Comment: NEGATIVE
Comment: NORMAL
Neisseria Gonorrhea: NEGATIVE

## 2020-04-14 LAB — WET PREP BY MOLECULAR PROBE
Candida species: NOT DETECTED
MICRO NUMBER:: 10852826
SPECIMEN QUALITY:: ADEQUATE
Trichomonas vaginosis: NOT DETECTED

## 2020-04-15 NOTE — Progress Notes (Signed)
Seen by RN for STI screening. Had no other needs from provider.

## 2020-04-16 ENCOUNTER — Other Ambulatory Visit: Payer: Self-pay | Admitting: Pediatrics

## 2020-04-16 DIAGNOSIS — N760 Acute vaginitis: Secondary | ICD-10-CM

## 2020-04-16 LAB — HIV ANTIBODY (ROUTINE TESTING W REFLEX): HIV 1&2 Ab, 4th Generation: NONREACTIVE

## 2020-04-16 LAB — RPR: RPR Ser Ql: NONREACTIVE

## 2020-04-16 MED ORDER — METRONIDAZOLE 500 MG PO TABS: 500.0000 mg | ORAL_TABLET | Freq: Two times a day (BID) | ORAL | 0 refills | Status: AC

## 2020-05-03 ENCOUNTER — Other Ambulatory Visit: Payer: Self-pay

## 2020-05-03 DIAGNOSIS — N946 Dysmenorrhea, unspecified: Secondary | ICD-10-CM

## 2020-05-03 MED ORDER — IBUPROFEN 800 MG PO TABS: 800.0000 mg | ORAL_TABLET | Freq: Three times a day (TID) | ORAL | 0 refills | Status: DC | PRN

## 2020-06-28 ENCOUNTER — Ambulatory Visit: Payer: Medicaid Other | Admitting: Pediatrics

## 2020-07-02 ENCOUNTER — Encounter: Payer: Self-pay | Admitting: Pediatrics

## 2020-07-02 ENCOUNTER — Ambulatory Visit (INDEPENDENT_AMBULATORY_CARE_PROVIDER_SITE_OTHER): Payer: Medicaid Other | Admitting: Pediatrics

## 2020-07-02 ENCOUNTER — Other Ambulatory Visit: Payer: Self-pay

## 2020-07-02 ENCOUNTER — Other Ambulatory Visit (HOSPITAL_COMMUNITY)
Admission: RE | Admit: 2020-07-02 | Discharge: 2020-07-02 | Disposition: A | Discharge status: Home / Self Care | Payer: Medicaid Other | Source: Ambulatory Visit | Attending: Pediatrics | Admitting: Pediatrics

## 2020-07-02 VITALS — BP 137/78 | HR 99 | Ht 68.0 in | Wt 181.8 lb

## 2020-07-02 DIAGNOSIS — Z113 Encounter for screening for infections with a predominantly sexual mode of transmission: Secondary | ICD-10-CM

## 2020-07-02 DIAGNOSIS — N898 Other specified noninflammatory disorders of vagina: Secondary | ICD-10-CM | POA: Diagnosis not present

## 2020-07-02 DIAGNOSIS — F4322 Adjustment disorder with anxiety: Secondary | ICD-10-CM

## 2020-07-02 DIAGNOSIS — N946 Dysmenorrhea, unspecified: Secondary | ICD-10-CM

## 2020-07-02 MED ORDER — FLUOXETINE HCL 20 MG PO CAPS: 20.0000 mg | ORAL_CAPSULE | Freq: Every day | ORAL | 3 refills | Status: DC

## 2020-07-02 NOTE — Progress Notes (Signed)
History was provided by the patient.  Gina Yang is a 19 y.o. female who is here for vaginal discharge and itching, anxiety.  [name], MD   HPI:  Here for mood check, STI screen/IUD follow up. +BV in August had treatment finished pills, no symptoms now.   Sexual active, irritated by condoms Trojan/Magnum were types she used. During sexual activity feels dry feeling. Using condoms all the time with boyfriend. No abnormal discharge. This is a new partner since August. No dysuria, hematuria, increased urine frequency. Feels safe with boyfriend.   1 menses in October, still gets monthly.   Still on Prozac, feels mood is stable. No thoughts about wanting to hurt or kill self. Doesn't want to see therapist, doesn't think she needs it, just wants to keep on Prozac.   Working makes boxes, likes it so far. Living with mother.   Got Covid-19 vaccine.   No fevers, headaches, belly pain, vomiting, constipation, diarrhea.   No alcohol use. Vape sometimes, some marijuana/tobacco use.   PHQ-SADS Last 3 Score only 01/16/2020  PHQ-15 Score 3  Total GAD-7 Score 5  PHQ-9 Total Score 5     No LMP recorded. (Menstrual status: IUD).  Review of Systems  Constitutional: Negative for malaise/fatigue.  Eyes: Negative for double vision.  Respiratory: Negative for shortness of breath.   Cardiovascular: Negative for chest pain and palpitations.  Gastrointestinal: Negative for abdominal pain, constipation, diarrhea, nausea and vomiting.  Genitourinary: Negative for dysuria.  Musculoskeletal: Negative for joint pain and myalgias.  Skin: Negative for rash.  Neurological: Negative for dizziness and headaches.  Endo/Heme/Allergies: Does not bruise/bleed easily.    Patient Active Problem List   Diagnosis Date Noted  . Elevated BP without diagnosis of hypertension 06/02/2017  . Obesity 06/02/2017  . IUD (intrauterine device) in place 10/10/2015  . Hx MRSA infection 06/28/2015  . Acne  09/21/2014  . Eczema 12/28/2013  . Mild persistent asthma 12/28/2013  . Allergic rhinitis 12/28/2013    Current Outpatient Medications on File Prior to Visit  Medication Sig Dispense Refill  . albuterol (PROAIR HFA) 108 (90 Base) MCG/ACT inhaler Inhale 2 puffs into the lungs every 6 (six) hours as needed for wheezing or shortness of breath. 17 g 0  . ibuprofen (ADVIL) 800 MG tablet Take 1 tablet (800 mg total) by mouth every 8 (eight) hours as needed. (Patient not taking: Reported on 07/02/2020) 30 tablet 0  . triamcinolone ointment (KENALOG) 0.5 % Apply 1 application topically 2 (two) times daily. (Patient not taking: Reported on 07/02/2020) 60 g 3   No current facility-administered medications on file prior to visit.    Allergies  Allergen Reactions  . Morphine And Related Itching and Nausea And Vomiting  . Peanut-Containing Drug Products Swelling    Swelling in the throat  . Shrimp [Shellfish Allergy] Swelling    Swelling in the throat     Physical Exam:    Vitals:   07/02/20 1035  BP: 137/78  Pulse: 99  Weight: 181 lb 12.8 oz (82.5 kg)  Height: 5\' 8"  (1.727 m)    Growth percentile SmartLinks can only be used for patients less than 46 years old.  Physical Exam Vitals and nursing note reviewed.  Constitutional:      General: She is not in acute distress.    Appearance: She is well-developed.  HENT:     Head: Normocephalic.  Eyes:     Extraocular Movements: Extraocular movements intact.     Conjunctiva/sclera: Conjunctivae normal.  Neck:     Thyroid: No thyromegaly.  Cardiovascular:     Rate and Rhythm: Normal rate and regular rhythm.     Heart sounds: No murmur heard.   Pulmonary:     Breath sounds: Normal breath sounds.  Abdominal:     General: There is no distension.     Palpations: Abdomen is soft. There is no mass.     Tenderness: There is no abdominal tenderness. There is no guarding.  Genitourinary:    General: Normal vulva.     Vagina: No vaginal  discharge.     Comments: Normal external exam. Performed bimanual without pain, strings in place. Musculoskeletal:     Right lower leg: No edema.     Left lower leg: No edema.  Lymphadenopathy:     Cervical: No cervical adenopathy.  Skin:    General: Skin is warm.     Capillary Refill: Capillary refill takes less than 2 seconds.     Findings: No rash.  Neurological:     General: No focal deficit present.     Mental Status: She is alert.  Psychiatric:        Mood and Affect: Mood normal.        Behavior: Behavior normal.    Assessment/Plan: 1. Vaginal dryness/ Routine screening for STI (sexually transmitted infection):   New partner, patient wanted repeat testing. Asymptomatic. Concern for allergy to condoms due to dry feeling with sex, discussed lubricant use and gave patient hand out on lube recommendations. No signs of external irritation on GU exam. No redness/swelling to suggest allergy.  - Urine cytology ancillary only - HIV antibody (with reflex) - [facility] - Condoms/lube given to patient  2. Adjustment disorder with anxiety: Feeling well on this dose.  - FLUoxetine (PROZAC) 20 MG capsule; Take 1 capsule (20 mg total) by mouth daily.  Dispense: 30 capsule; Refill: 3 - Follow up in 3 months.  Tonna Corner, MD

## 2020-07-02 NOTE — Progress Notes (Signed)
I have reviewed the resident's note and plan of care and helped develop the plan as necessary.  Woodford Strege, FNP   

## 2020-07-03 LAB — RPR: RPR Ser Ql: NONREACTIVE

## 2020-07-03 LAB — HIV ANTIBODY (ROUTINE TESTING W REFLEX): HIV 1&2 Ab, 4th Generation: NONREACTIVE

## 2020-07-04 ENCOUNTER — Other Ambulatory Visit: Payer: Self-pay

## 2020-07-04 DIAGNOSIS — N946 Dysmenorrhea, unspecified: Secondary | ICD-10-CM

## 2020-07-04 LAB — URINE CYTOLOGY ANCILLARY ONLY
Bacterial Vaginitis-Urine: NEGATIVE
Chlamydia: NEGATIVE
Comment: NEGATIVE
Comment: NEGATIVE
Comment: NORMAL
Neisseria Gonorrhea: NEGATIVE
Trichomonas: NEGATIVE

## 2020-10-02 ENCOUNTER — Ambulatory Visit: Payer: Medicaid Other | Admitting: Pediatrics

## 2020-10-05 ENCOUNTER — Ambulatory Visit (INDEPENDENT_AMBULATORY_CARE_PROVIDER_SITE_OTHER): Payer: Medicaid Other | Admitting: Family

## 2020-10-05 ENCOUNTER — Encounter: Payer: Self-pay | Admitting: Family

## 2020-10-05 ENCOUNTER — Other Ambulatory Visit: Payer: Self-pay

## 2020-10-05 ENCOUNTER — Other Ambulatory Visit (HOSPITAL_COMMUNITY)
Admission: RE | Admit: 2020-10-05 | Discharge: 2020-10-05 | Disposition: A | Discharge status: Home / Self Care | Payer: Medicaid Other | Source: Ambulatory Visit | Attending: Family | Admitting: Family

## 2020-10-05 VITALS — BP 124/78 | HR 80 | Ht 67.0 in | Wt 193.8 lb

## 2020-10-05 DIAGNOSIS — N921 Excessive and frequent menstruation with irregular cycle: Secondary | ICD-10-CM | POA: Diagnosis not present

## 2020-10-05 DIAGNOSIS — Z30016 Encounter for initial prescription of transdermal patch hormonal contraceptive device: Secondary | ICD-10-CM | POA: Diagnosis not present

## 2020-10-05 DIAGNOSIS — Z113 Encounter for screening for infections with a predominantly sexual mode of transmission: Secondary | ICD-10-CM

## 2020-10-05 DIAGNOSIS — Z30432 Encounter for removal of intrauterine contraceptive device: Secondary | ICD-10-CM

## 2020-10-05 DIAGNOSIS — Z975 Presence of (intrauterine) contraceptive device: Secondary | ICD-10-CM

## 2020-10-05 MED ORDER — NORELGESTROMIN-ETH ESTRADIOL 150-35 MCG/24HR TD PTWK: 1.0000 | MEDICATED_PATCH | TRANSDERMAL | 12 refills | Status: AC

## 2020-10-05 NOTE — Progress Notes (Signed)
History was provided by the patient.  Gina Yang is a 20 y.o. female who is here for IUD removal, new method.   PCP confirmed? Yes.    [name], MD  HPI:   -IUD inserted 10/10/2015 -no bleeding until 1/24 thru 2/3  -started again on 2/8 -wants IUD pulled; considering return to nexplanon, pills, or patch  -no hx of cancers, known liver disease, clotting disorder, PE/DVT -she has experienced pain with intercourse: penetration and insertion  -uses vaseline for lube  -no vaginal lesions, no discharge changes -fishy smell last week and again today  -no abdominal pain, fullness/pelvic cramping  -no constipation issues -no dysuria/urgency/increased frequency  -safe in her relationship; no pregnancy intention; does not feel coerced to change method   Review of Systems  Constitutional: Negative for chills, fever and malaise/fatigue.  HENT: Negative for sore throat.   Cardiovascular: Negative for chest pain and palpitations.  Gastrointestinal: Negative for abdominal pain, constipation and nausea.  Genitourinary: Negative for dysuria.  Skin: Negative for rash.  Neurological: Negative for dizziness and headaches.  Psychiatric/Behavioral: The patient is not nervous/anxious.      Patient Active Problem List   Diagnosis Date Noted  . Elevated BP without diagnosis of hypertension 06/02/2017  . Obesity 06/02/2017  . IUD (intrauterine device) in place 10/10/2015  . Hx MRSA infection 06/28/2015  . Acne 09/21/2014  . Eczema 12/28/2013  . Mild persistent asthma 12/28/2013  . Allergic rhinitis 12/28/2013    Current Outpatient Medications on File Prior to Visit  Medication Sig Dispense Refill  . albuterol (PROAIR HFA) 108 (90 Base) MCG/ACT inhaler Inhale 2 puffs into the lungs every 6 (six) hours as needed for wheezing or shortness of breath. 17 g 0  . FLUoxetine (PROZAC) 20 MG capsule Take 1 capsule (20 mg total) by mouth daily. 30 capsule 3  . ibuprofen (ADVIL) 800 MG tablet  Take 1 tablet (800 mg total) by mouth every 8 (eight) hours as needed. (Patient not taking: No sig reported) 30 tablet 0  . triamcinolone ointment (KENALOG) 0.5 % Apply 1 application topically 2 (two) times daily. (Patient not taking: No sig reported) 60 g 3   No current facility-administered medications on file prior to visit.    Allergies  Allergen Reactions  . Morphine And Related Itching and Nausea And Vomiting  . Peanut-Containing Drug Products Swelling    Swelling in the throat  . Shrimp [Shellfish Allergy] Swelling    Swelling in the throat    Physical Exam:    Vitals:   10/05/20 0851  BP: 124/78  Pulse: 80  Weight: 193 lb 12.8 oz (87.9 kg)  Height: 5\' 7"  (1.702 m)   Wt Readings from Last 3 Encounters:  10/05/20 193 lb 12.8 oz (87.9 kg)  07/02/20 181 lb 12.8 oz (82.5 kg)  04/12/20 173 lb 9.6 oz (78.7 kg)    Growth percentile SmartLinks can only be used for patients less than 64 years old. No LMP recorded. (Menstrual status: IUD).  Physical Exam Vitals reviewed. Exam conducted with a chaperone present.  Constitutional:      Appearance: Normal appearance. She is not toxic-appearing.  HENT:     Head: Normocephalic.     Mouth/Throat:     Pharynx: Oropharynx is clear.  Eyes:     General: No scleral icterus.    Extraocular Movements: Extraocular movements intact.     Pupils: Pupils are equal, round, and reactive to light.  Cardiovascular:     Rate and Rhythm: Normal rate.  Pulmonary:     Effort: Pulmonary effort is normal.  Abdominal:     General: There is no distension.     Palpations: Abdomen is soft.     Tenderness: There is no abdominal tenderness.  Genitourinary:    General: Normal vulva.     Vagina: No vaginal discharge.     Cervix: Normal.     Uterus: Normal.      Adnexa:        Right: No tenderness.         Left: No tenderness.       Comments: Strings visible at os. IUD pulled without incident.  No bleeding Musculoskeletal:     Cervical back:  Normal range of motion.  Lymphadenopathy:     Lower Body: No right inguinal adenopathy. No left inguinal adenopathy.  Neurological:     Mental Status: She is alert.      Assessment/Plan:  20 yo female presents for IUD removal after breakthrough bleeding; IUD was inserted in 10/10/2015. We reviewed reasons for breakthrough bleeding including infection, life of product, thyroid etiologies, structural abnormalities. No accompanying symptoms. Will screen for infection; IUD was pulled without incident. Reviewed use of patch, including risks/benefits including efficacy with perfect use, skin irritation at site, increase thrombotic risks associated with COCs, bleeding. Return in 8 weeks or sooner.    1. Breakthrough bleeding with IUD 2. Encounter for IUD removal 3. Encounter for initial prescription of transdermal patch hormonal contraceptive device 4. Routine screening for STI (sexually transmitted infection) - Urine cytology ancillary only - WET PREP BY MOLECULAR PROBE

## 2020-10-08 ENCOUNTER — Other Ambulatory Visit: Payer: Self-pay

## 2020-10-08 DIAGNOSIS — N946 Dysmenorrhea, unspecified: Secondary | ICD-10-CM

## 2020-10-08 LAB — URINE CYTOLOGY ANCILLARY ONLY
Bacterial Vaginitis-Urine: NEGATIVE
Candida Urine: POSITIVE — AB
Chlamydia: NEGATIVE
Comment: NEGATIVE
Comment: NEGATIVE
Comment: NORMAL
Neisseria Gonorrhea: NEGATIVE
Trichomonas: NEGATIVE

## 2020-10-08 LAB — WET PREP BY MOLECULAR PROBE
Candida species: NOT DETECTED
MICRO NUMBER:: 11524616
SPECIMEN QUALITY:: ADEQUATE
Trichomonas vaginosis: NOT DETECTED

## 2020-10-08 MED ORDER — IBUPROFEN 800 MG PO TABS: 800.0000 mg | ORAL_TABLET | Freq: Three times a day (TID) | ORAL | 0 refills | Status: DC | PRN

## 2020-11-30 ENCOUNTER — Other Ambulatory Visit: Payer: Self-pay

## 2020-11-30 ENCOUNTER — Telehealth: Payer: Medicaid Other | Admitting: Family

## 2021-04-25 ENCOUNTER — Other Ambulatory Visit: Payer: Self-pay | Admitting: Pediatrics

## 2021-04-25 DIAGNOSIS — L309 Dermatitis, unspecified: Secondary | ICD-10-CM

## 2021-05-31 ENCOUNTER — Emergency Department (HOSPITAL_BASED_OUTPATIENT_CLINIC_OR_DEPARTMENT_OTHER)
Admission: EM | Admit: 2021-05-31 | Discharge: 2021-05-31 | Disposition: A | Discharge status: Home / Self Care | Payer: Medicaid Other | Attending: Student | Admitting: Student

## 2021-05-31 ENCOUNTER — Emergency Department (HOSPITAL_COMMUNITY)
Admission: EM | Admit: 2021-05-31 | Discharge: 2021-05-31 | Disposition: A | Discharge status: Home / Self Care | Payer: Medicaid Other | Source: Home / Self Care | Attending: Student | Admitting: Student

## 2021-05-31 ENCOUNTER — Encounter (HOSPITAL_COMMUNITY): Payer: Self-pay

## 2021-05-31 ENCOUNTER — Other Ambulatory Visit: Payer: Self-pay

## 2021-05-31 ENCOUNTER — Encounter (HOSPITAL_BASED_OUTPATIENT_CLINIC_OR_DEPARTMENT_OTHER): Payer: Self-pay | Admitting: *Deleted

## 2021-05-31 DIAGNOSIS — Z5321 Procedure and treatment not carried out due to patient leaving prior to being seen by health care provider: Secondary | ICD-10-CM | POA: Insufficient documentation

## 2021-05-31 DIAGNOSIS — L089 Local infection of the skin and subcutaneous tissue, unspecified: Secondary | ICD-10-CM | POA: Insufficient documentation

## 2021-05-31 DIAGNOSIS — J4530 Mild persistent asthma, uncomplicated: Secondary | ICD-10-CM | POA: Diagnosis not present

## 2021-05-31 DIAGNOSIS — Z91010 Allergy to peanuts: Secondary | ICD-10-CM | POA: Insufficient documentation

## 2021-05-31 DIAGNOSIS — F17290 Nicotine dependence, other tobacco product, uncomplicated: Secondary | ICD-10-CM | POA: Insufficient documentation

## 2021-05-31 DIAGNOSIS — T23421A Corrosion of unspecified degree of single right finger (nail) except thumb, initial encounter: Secondary | ICD-10-CM

## 2021-05-31 DIAGNOSIS — L02512 Cutaneous abscess of left hand: Secondary | ICD-10-CM | POA: Insufficient documentation

## 2021-05-31 MED ORDER — BACITRACIN ZINC 500 UNIT/GM EX OINT
TOPICAL_OINTMENT | Freq: Two times a day (BID) | CUTANEOUS | Status: DC
  Filled 2021-05-31: qty 0.9

## 2021-05-31 MED ORDER — CEFADROXIL 500 MG PO CAPS
500.0000 mg | ORAL_CAPSULE | Freq: Two times a day (BID) | ORAL | 0 refills | Status: AC
Start: 2021-05-31 — End: 2021-06-07

## 2021-05-31 NOTE — ED Triage Notes (Signed)
Right fourth finger infection for 3 weeks.

## 2021-05-31 NOTE — ED Triage Notes (Signed)
Pt reports abscess on right ring finger for 3 weeks.

## 2021-05-31 NOTE — ED Provider Notes (Signed)
[facility]-EMERGENCY DEPT Provider Note   CSN: [id number] Arrival date & time: 05/31/21  1821     History No chief complaint on file.   Gina Yang is a 20 y.o. female who presents emergency department for evaluation of a finger abscess.  She states that she has had worsening pain around the left fourth digit around the nailbed and is concerned that there might be an abscess developing.  She endorses purulent drainage draining from the nailbed.  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  Denies numbness, tingling, weakness of the extremity.  Has full range of motion of the digit.  HPI     Past Medical History:  Diagnosis Date   Allergy    Asthma    Boil of buttock    Eczema     Patient Active Problem List   Diagnosis Date Noted   Elevated BP without diagnosis of hypertension 06/02/2017   Obesity 06/02/2017   IUD (intrauterine device) in place 10/10/2015   Hx MRSA infection 06/28/2015   Acne 09/21/2014   Eczema 12/28/2013   Mild persistent asthma 12/28/2013   Allergic rhinitis 12/28/2013    Past Surgical History:  Procedure Laterality Date   NO PAST SURGERIES       OB History     Gravida  0   Para      Term      Preterm      AB      Living         SAB      IAB      Ectopic      Multiple      Live Births              Family History  Problem Relation Age of Onset   Eczema Brother     Social History   Tobacco Use   Smoking status: Every Day    Types: Cigars   Smokeless tobacco: Never   Tobacco comments:    mom smokes  Vaping Use   Vaping Use: Never used  Substance Use Topics   Alcohol use: Not Currently    Comment: very little    Drug use: Never    Home Medications Prior to Admission medications   Medication Sig Start Date End Date Taking? Authorizing Provider  ibuprofen (ADVIL) 800 MG tablet Take 1 tablet (800 mg total) by mouth every 8 (eight) hours as needed.  10/08/20   Georges Mouse, NP  norelgestromin-ethinyl estradiol (ORTHO EVRA) 150-35 MCG/24HR transdermal patch Place 1 patch onto the skin once a week. 10/05/20   Georges Mouse, NP  triamcinolone ointment (KENALOG) 0.5 % APPLY TOPICALLY TO THE AFFECTED AREA TWICE DAILY 04/25/21   Georges Mouse, NP    Allergies    Morphine and related, Peanut-containing drug products, and Shrimp [shellfish allergy]  Review of Systems   Review of Systems  Constitutional:  Negative for chills and fever.  HENT:  Negative for ear pain and sore throat.   Eyes:  Negative for pain and visual disturbance.  Respiratory:  Negative for cough and shortness of breath.   Cardiovascular:  Negative for chest pain and palpitations.  Gastrointestinal:  Negative for abdominal pain and vomiting.  Genitourinary:  Negative for dysuria and hematuria.  Musculoskeletal:  Negative for arthralgias and back pain.       Left finger pain  Skin:  Negative for color change and rash.  Neurological:  Negative  for seizures and syncope.  All other systems reviewed and are negative.  Physical Exam Updated Vital Signs BP (!) 160/97 (BP Location: Left Arm)   Pulse 87   Temp 98 F (36.7 C) (Oral)   Resp 18   LMP 05/11/2021   SpO2 100%   Physical Exam Vitals and nursing note reviewed.  Constitutional:      General: She is not in acute distress.    Appearance: She is well-developed.  HENT:     Head: Normocephalic and atraumatic.  Eyes:     Conjunctiva/sclera: Conjunctivae normal.  Cardiovascular:     Rate and Rhythm: Normal rate and regular rhythm.     Heart sounds: No murmur heard. Pulmonary:     Effort: Pulmonary effort is normal. No respiratory distress.     Breath sounds: Normal breath sounds.  Abdominal:     Palpations: Abdomen is soft.     Tenderness: There is no abdominal tenderness.  Musculoskeletal:        General: Tenderness present.     Cervical back: Neck supple.     Comments: Tenderness to the distal tip  of the fourth digit on the right with dry cracked skin, no appreciable purulence erythema  Skin:    General: Skin is warm and dry.  Neurological:     Mental Status: She is alert.    ED Results / Procedures / Treatments   Labs (all labs ordered are listed, but only abnormal results are displayed) Labs Reviewed - No data to display  EKG None  Radiology No results found.  Procedures Procedures   Medications Ordered in ED Medications - No data to display  ED Course  I have reviewed the triage vital signs and the nursing notes.  Pertinent labs & imaging results that were available during my care of the patient were reviewed by me and considered in my medical decision making (see chart for details).    MDM Rules/Calculators/A&P                           Patient seen emergency department for evaluation of finger pain.  Physical exam reveals dry cracked skin and tenderness to the distal tip of the fourth digit on the right.  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  She was instructed to wear gloves at all times while handling chemicals and a referral was sent to the hand specialists for evaluation next week.  She was given return precautions which she voiced understanding and she was discharged. Final Clinical Impression(s) / ED Diagnoses Final diagnoses:  None    Rx / DC Orders ED Discharge Orders     None        Navina Wohlers, Wyn Forster, MD 05/31/21 1927

## 2021-05-31 NOTE — Discharge Instructions (Addendum)
You were seen in the emergency department for an evaluation of finger pain.  I am concerned that your injury may be related to over exposure to cleaning chemicals at this time, and it is important to limit your exposure to caustic chemicals by wearing gloves at all times while working.  This may be a developing infection called a paronychia we will treat with antibiotics today for 1 week to see if this will improve her symptoms.  Please call the number above for [facility] and schedule an appointment with a hand specialist next week.  At this time you are safe for discharge, return if you have difficulty moving the finger, numbness and tingling of the finger, persistent pus draining from the nailbed or any other concerning symptoms.

## 2021-06-06 ENCOUNTER — Other Ambulatory Visit: Payer: Self-pay

## 2021-06-06 ENCOUNTER — Ambulatory Visit (INDEPENDENT_AMBULATORY_CARE_PROVIDER_SITE_OTHER): Payer: Medicaid Other | Admitting: Orthopedic Surgery

## 2021-06-06 ENCOUNTER — Encounter: Payer: Self-pay | Admitting: Orthopedic Surgery

## 2021-06-06 DIAGNOSIS — T23121A Burn of first degree of single right finger (nail) except thumb, initial encounter: Secondary | ICD-10-CM | POA: Diagnosis not present

## 2021-06-08 DIAGNOSIS — T23121A Burn of first degree of single right finger (nail) except thumb, initial encounter: Secondary | ICD-10-CM | POA: Insufficient documentation

## 2021-06-08 NOTE — Progress Notes (Signed)
Office Visit Note   Patient: Gina Yang           Date of Birth: [birth year]           MRN: [id number] Visit Date: 06/06/2021              Requested by: Glendora Score, MD [address] [place],  [place] [address] PCP: [name], MD   Assessment & Plan: Visit Diagnoses:  1. Superficial burn of right ring finger     Plan: Discussed with patient that her skin changes are likely secondary to her burn over this part of her finger 3 weeks ago.  She may have also irritated her skin with the cleaning supplies that she uses at work.  She has no purulence from the nail fold.  She has no purulence from the ulnar side of this finger.  She notes that the appearance and pain in this finger are steadily improving.  We discussed continued wound care w/ daily soaks with diluted Hibiclens solution and application of bacitracin ointment.  Follow-Up Instructions: No follow-ups on file.   Orders:  No orders of the defined types were placed in this encounter.  No orders of the defined types were placed in this encounter.     Procedures: No procedures performed   Clinical Data: No additional findings.   Subjective: Chief Complaint  Patient presents with   Right Ring Finger - New Patient (Initial Visit)    This is a 20 yo RHD F who presents with skin changes at the ulnar aspect of her right ring finger.  This started 3 weeks ago when she burned this part of her finger.  She had been putting neosporin and peroxide on the finger.  She has since developed scaly, darkened skin at the ulnar aspect of this finger w/ separation of the lateral nail fold from the nail as you would see in a paronychia.  She reports previously seeing pus from the nail fold but none has been present for the last several days.  She notes that both the pain and appearance of her finger has been improving over the last week.  She works with [employer], community without any gloves.    Review of  Systems   Objective: Vital Signs: BP 134/85 (BP Location: Left Arm, Patient Position: Sitting, Cuff Size: Normal)   Pulse 75   Ht 5\' 6"  (1.676 m)   Wt 205 lb 6.4 oz (93.2 kg)   LMP 05/11/2021   SpO2 98%   BMI 33.15 kg/m   Physical Exam Constitutional:      Appearance: Normal appearance.  Cardiovascular:     Rate and Rhythm: Normal rate.     Pulses: Normal pulses.  Pulmonary:     Effort: Pulmonary effort is normal.  Skin:    General: Skin is warm and dry.     Capillary Refill: Capillary refill takes less than 2 seconds.  Neurological:     Mental Status: She is alert.    Right Hand Exam   Tenderness  Right hand tenderness location: Minimal tenderness at ulnar aspect of right ring finger.  Other  Sensation: normal Pulse: present  Comments:  Dark, scaly skin at ulnar aspect of ring finger tip.  No significant swelling.  Ulnar nail fold his lifted away from the nail slightly as would be seen with a paronychia.  There is no erythema, swelling, or tenderness around the nail fold.  No expressible purulence.     Specialty  Comments:  No specialty comments available.  Imaging: No results found.   PMFS History: Patient Active Problem List   Diagnosis Date Noted   Superficial burn of right ring finger 06/08/2021   Elevated BP without diagnosis of hypertension 06/02/2017   Obesity 06/02/2017   IUD (intrauterine device) in place 10/10/2015   Hx MRSA infection 06/28/2015   Acne 09/21/2014   Eczema 12/28/2013   Mild persistent asthma 12/28/2013   Allergic rhinitis 12/28/2013   Past Medical History:  Diagnosis Date   Allergy    Asthma    Boil of buttock    Eczema     Family History  Problem Relation Age of Onset   Eczema Brother     Past Surgical History:  Procedure Laterality Date   NO PAST SURGERIES     Social History   Occupational History   Not on file  Tobacco Use   Smoking status: Every Day    Types: Cigars   Smokeless tobacco: Never   Tobacco  comments:    mom smokes  Vaping Use   Vaping Use: Never used  Substance and Sexual Activity   Alcohol use: Not Currently    Comment: very little    Drug use: Never   Sexual activity: Yes    Birth control/protection: I.U.D.

## 2021-07-28 DIAGNOSIS — J4530 Mild persistent asthma, uncomplicated: Secondary | ICD-10-CM | POA: Diagnosis not present

## 2021-07-28 DIAGNOSIS — F17290 Nicotine dependence, other tobacco product, uncomplicated: Secondary | ICD-10-CM | POA: Insufficient documentation

## 2021-07-28 DIAGNOSIS — R070 Pain in throat: Secondary | ICD-10-CM | POA: Diagnosis present

## 2021-07-28 DIAGNOSIS — Z20822 Contact with and (suspected) exposure to covid-19: Secondary | ICD-10-CM | POA: Diagnosis not present

## 2021-07-28 DIAGNOSIS — Z91010 Allergy to peanuts: Secondary | ICD-10-CM | POA: Insufficient documentation

## 2021-07-28 DIAGNOSIS — J020 Streptococcal pharyngitis: Secondary | ICD-10-CM | POA: Insufficient documentation

## 2021-07-29 ENCOUNTER — Emergency Department (HOSPITAL_COMMUNITY)
Admission: EM | Admit: 2021-07-29 | Discharge: 2021-07-29 | Disposition: A | Discharge status: Home / Self Care | Payer: Medicaid Other | Attending: Emergency Medicine | Admitting: Emergency Medicine

## 2021-07-29 ENCOUNTER — Other Ambulatory Visit: Payer: Self-pay

## 2021-07-29 ENCOUNTER — Encounter (HOSPITAL_COMMUNITY): Payer: Self-pay

## 2021-07-29 DIAGNOSIS — J020 Streptococcal pharyngitis: Secondary | ICD-10-CM

## 2021-07-29 LAB — GROUP A STREP BY PCR: Group A Strep by PCR: DETECTED — AB

## 2021-07-29 LAB — MONONUCLEOSIS SCREEN: Mono Screen: NEGATIVE

## 2021-07-29 LAB — RESP PANEL BY RT-PCR (FLU A&B, COVID) ARPGX2
Influenza A by PCR: NEGATIVE
Influenza B by PCR: NEGATIVE
SARS Coronavirus 2 by RT PCR: NEGATIVE

## 2021-07-29 MED ORDER — DEXAMETHASONE 4 MG PO TABS
10.0000 mg | ORAL_TABLET | Freq: Once | ORAL | Status: AC
  Administered 2021-07-29: 10 mg via ORAL
  Filled 2021-07-29: qty 1

## 2021-07-29 MED ORDER — AMOXICILLIN 500 MG PO CAPS
500.0000 mg | ORAL_CAPSULE | Freq: Two times a day (BID) | ORAL | 0 refills | Status: DC
Start: 2021-07-29 — End: 2021-10-08
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
## 2021-07-29 NOTE — ED Provider Notes (Signed)
[facility]-EMERGENCY DEPT Provider Note   CSN: [id number] Arrival date & time: 07/28/21  2350     History Chief Complaint  Patient presents with   Sore Throat   Fever    Gina Yang is a 20 y.o. female.  The history is provided by the patient.  Sore Throat  Fever Gina Yang is a 20 y.o. female who presents to the Emergency Department complaining of sore throat. She presents the emergency department accompanied by her mother for evaluation of sore throat that started last Tuesday. She has associated subjective fevers. Her throat hurts more on the right than the left. She has pain with swallowing but is able to swallow. She is breathing fine. No difficulty breathing. No associated nausea, vomiting. Symptoms are moderate in nature.    Past Medical History:  Diagnosis Date   Allergy    Asthma    Boil of buttock    Eczema     Patient Active Problem List   Diagnosis Date Noted   Superficial burn of right ring finger 06/08/2021   Elevated BP without diagnosis of hypertension 06/02/2017   Obesity 06/02/2017   IUD (intrauterine device) in place 10/10/2015   Hx MRSA infection 06/28/2015   Acne 09/21/2014   Eczema 12/28/2013   Mild persistent asthma 12/28/2013   Allergic rhinitis 12/28/2013    Past Surgical History:  Procedure Laterality Date   NO PAST SURGERIES       OB History     Gravida  0   Para      Term      Preterm      AB      Living         SAB      IAB      Ectopic      Multiple      Live Births              Family History  Problem Relation Age of Onset   Eczema Brother     Social History   Tobacco Use   Smoking status: Every Day    Types: Cigars   Smokeless tobacco: Never   Tobacco comments:    mom smokes  Vaping Use   Vaping Use: Never used  Substance Use Topics   Alcohol use: Not Currently    Comment: very little    Drug use: Never    Home Medications Prior to Admission medications    Medication Sig Start Date End Date Taking? Authorizing Provider  amoxicillin (AMOXIL) 500 MG capsule Take 1 capsule (500 mg total) by mouth 2 (two) times daily. 07/29/21  Yes Tilden Fossa, MD  ibuprofen (ADVIL) 800 MG tablet Take 1 tablet (800 mg total) by mouth every 8 (eight) hours as needed. 10/08/20   Georges Mouse, NP  norelgestromin-ethinyl estradiol (ORTHO EVRA) 150-35 MCG/24HR transdermal patch Place 1 patch onto the skin once a week. 10/05/20   Georges Mouse, NP  triamcinolone ointment (KENALOG) 0.5 % APPLY TOPICALLY TO THE AFFECTED AREA TWICE DAILY 04/25/21   Georges Mouse, NP    Allergies    Morphine and related, Peanut-containing drug products, and Shrimp [shellfish allergy]  Review of Systems   Review of Systems  Constitutional:  Positive for fever.  All other systems reviewed and are negative.  Physical Exam Updated Vital Signs BP (!) 150/93 (BP Location: Left Arm)   Pulse 92   Temp 98.3 F (36.8 C) (Oral)   Resp 16  SpO2 99%   Physical Exam Vitals and nursing note reviewed.  Constitutional:      Appearance: She is well-developed.  HENT:     Head: Normocephalic and atraumatic.     Comments: Moderate erythema and edema in the posterior OP, right tonsil slightly larger than the left.  No significant exudate.  Uvula midline.   Cardiovascular:     Rate and Rhythm: Normal rate and regular rhythm.     Heart sounds: No murmur heard. Pulmonary:     Effort: Pulmonary effort is normal. No respiratory distress.     Breath sounds: Normal breath sounds.  Musculoskeletal:        General: No tenderness.     Cervical back: Neck supple.  Lymphadenopathy:     Cervical: Cervical adenopathy present.  Skin:    General: Skin is warm and dry.  Neurological:     Mental Status: She is alert and oriented to person, place, and time.  Psychiatric:        Behavior: Behavior normal.    ED Results / Procedures / Treatments   Labs (all labs ordered are listed, but only  abnormal results are displayed) Labs Reviewed  GROUP A STREP BY PCR - Abnormal; Notable for the following components:      Result Value   Group A Strep by PCR DETECTED (*)    All other components within normal limits  RESP PANEL BY RT-PCR (FLU A&B, COVID) ARPGX2  MONONUCLEOSIS SCREEN    EKG None  Radiology No results found.  Procedures Procedures   Medications Ordered in ED Medications  dexamethasone (DECADRON) tablet 10 mg (has no administration in time range)    ED Course  I have reviewed the triage vital signs and the nursing notes.  Pertinent labs & imaging results that were available during my care of the patient were reviewed by me and considered in my medical decision making (see chart for details).    MDM Rules/Calculators/A&P                          patient here for evaluation of sore throat, fever. She is positive for strep the emergency department. No evidence of peritonsilar abscess. Discussed with patient home care for strep throat. Discussed outpatient follow-up and return precautions.  Final Clinical Impression(s) / ED Diagnoses Final diagnoses:  Strep pharyngitis    Rx / DC Orders ED Discharge Orders          Ordered    amoxicillin (AMOXIL) 500 MG capsule  2 times daily        07/29/21 0205             Tilden Fossa, MD 07/29/21 [phone]

## 2021-10-08 ENCOUNTER — Emergency Department (HOSPITAL_COMMUNITY)
Admission: EM | Admit: 2021-10-08 | Discharge: 2021-10-08 | Disposition: A | Discharge status: Home / Self Care | Payer: Medicaid Other | Attending: Emergency Medicine | Admitting: Emergency Medicine

## 2021-10-08 ENCOUNTER — Other Ambulatory Visit: Payer: Self-pay

## 2021-10-08 ENCOUNTER — Encounter (HOSPITAL_COMMUNITY): Payer: Self-pay

## 2021-10-08 DIAGNOSIS — Z20822 Contact with and (suspected) exposure to covid-19: Secondary | ICD-10-CM | POA: Insufficient documentation

## 2021-10-08 DIAGNOSIS — J020 Streptococcal pharyngitis: Secondary | ICD-10-CM | POA: Diagnosis not present

## 2021-10-08 DIAGNOSIS — J029 Acute pharyngitis, unspecified: Secondary | ICD-10-CM | POA: Diagnosis present

## 2021-10-08 LAB — GROUP A STREP BY PCR: Group A Strep by PCR: DETECTED — AB

## 2021-10-08 LAB — RESP PANEL BY RT-PCR (FLU A&B, COVID) ARPGX2
Influenza A by PCR: NEGATIVE
Influenza B by PCR: NEGATIVE
SARS Coronavirus 2 by RT PCR: NEGATIVE

## 2021-10-08 MED ORDER — ACETAMINOPHEN 325 MG PO TABS
650.0000 mg | ORAL_TABLET | Freq: Once | ORAL | Status: AC
  Administered 2021-10-08: 650 mg via ORAL
  Filled 2021-10-08: qty 2

## 2021-10-08 MED ORDER — AMOXICILLIN 500 MG PO CAPS
1000.0000 mg | ORAL_CAPSULE | Freq: Once | ORAL | Status: AC
  Administered 2021-10-08: 1000 mg via ORAL
  Filled 2021-10-08: qty 2

## 2021-10-08 MED ORDER — AMOXICILLIN 500 MG PO CAPS: 1000.0000 mg | ORAL_CAPSULE | Freq: Every day | ORAL | 0 refills | Status: AC

## 2021-10-08 NOTE — ED Triage Notes (Signed)
Patient c/o sore throat, headache, and runny nose x 2 days.

## 2021-10-08 NOTE — Discharge Instructions (Addendum)
Be sure to take the antibiotics until completed, even if you are feeling better.  You were given your first dose here in the emergency department, start the next dose tomorrow.  If you develop high fever, vomiting, inability to swallow, trouble breathing, or any other new/concerning symptoms then return to the ER for evaluation.

## 2021-10-08 NOTE — ED Provider Notes (Signed)
[facility] DEPT Provider Note   CSN: [id number] Arrival date & time: 10/08/21  1047     History  Chief Complaint  Patient presents with   Sore Throat   Headache   Nasal Congestion    Gina Yang is a 21 y.o. female.  HPI 21 year old female presents with sore throat and concern for strep.  She states that she has had this before and wanted to come in before it got too bad.  Started 2 days ago.  Is having mostly right-sided throat pain as well as painful swallowing.  No fevers but maybe some chills.  No cough.  She chronically has rhinorrhea.  No vomiting, inability to swallow, or neck stiffness.  She has not taken anything for this.  Home Medications Prior to Admission medications   Medication Sig Start Date End Date Taking? Authorizing Provider  amoxicillin (AMOXIL) 500 MG capsule Take 2 capsules (1,000 mg total) by mouth daily for 9 days. 10/08/21 10/17/21 Yes Sherwood Gambler, MD  ibuprofen (ADVIL) 800 MG tablet Take 1 tablet (800 mg total) by mouth every 8 (eight) hours as needed. 10/08/20   Parthenia Ames, NP  norelgestromin-ethinyl estradiol (ORTHO EVRA) 150-35 MCG/24HR transdermal patch Place 1 patch onto the skin once a week. 10/05/20   Parthenia Ames, NP  triamcinolone ointment (KENALOG) 0.5 % APPLY TOPICALLY TO THE AFFECTED AREA TWICE DAILY 04/25/21   Parthenia Ames, NP      Allergies    Morphine and related, Peanut-containing drug products, and Shrimp [shellfish allergy]    Review of Systems   Review of Systems  Constitutional:  Negative for fever.  HENT:  Positive for sore throat. Negative for trouble swallowing.   Gastrointestinal:  Negative for vomiting.  Musculoskeletal:  Negative for neck pain and neck stiffness.   Physical Exam Updated Vital Signs BP 135/89 (BP Location: Right Arm)    Pulse 74    Temp 97.8 F (36.6 C) (Oral)    Resp 16    Ht 5\' 6"  (1.676 m)    Wt 91.2 kg    LMP 10/01/2021 (Exact Date)    SpO2 100%    BMI  32.44 kg/m  Physical Exam Vitals and nursing note reviewed.  Constitutional:      Appearance: She is well-developed.  HENT:     Head: Normocephalic and atraumatic.     Mouth/Throat:     Pharynx: Uvula midline. Posterior oropharyngeal erythema present.     Tonsils: Tonsillar exudate (mild, left sided) present. No tonsillar abscesses.  Cardiovascular:     Rate and Rhythm: Normal rate and regular rhythm.     Heart sounds: Normal heart sounds.  Pulmonary:     Effort: Pulmonary effort is normal.     Breath sounds: Normal breath sounds.  Abdominal:     General: There is no distension.  Skin:    General: Skin is warm and dry.  Neurological:     Mental Status: She is alert.    ED Results / Procedures / Treatments   Labs (all labs ordered are listed, but only abnormal results are displayed) Labs Reviewed  GROUP A STREP BY PCR - Abnormal; Notable for the following components:      Result Value   Group A Strep by PCR DETECTED (*)    All other components within normal limits  RESP PANEL BY RT-PCR (FLU A&B, COVID) ARPGX2  POC URINE PREG, ED    EKG None  Radiology No results found.  Procedures Procedures  Medications Ordered in ED Medications  acetaminophen (TYLENOL) tablet 650 mg (650 mg Oral Given 10/08/21 1249)  amoxicillin (AMOXIL) capsule 1,000 mg (1,000 mg Oral Given 10/08/21 1249)    ED Course/ Medical Decision Making/ A&P                           Medical Decision Making Risk OTC drugs. Prescription drug management.   Strep test is positive. Exam consistent with pharyngitis. Low concern for deep space infection. No PTA or uvulitis. Will treat with amoxicillin. Given recurrent strep, will also refer to ENT. She has refused pregnancy test, but also doesn't think she's pregnant. Ibuprofen/tylenol for pain. Given first dose of antibiotics here. D/c with return precautions.         Final Clinical Impression(s) / ED Diagnoses Final diagnoses:  Strep  pharyngitis    Rx / DC Orders ED Discharge Orders          Ordered    amoxicillin (AMOXIL) 500 MG capsule  Daily        10/08/21 1250              Sherwood Gambler, MD 10/08/21 [phone]

## 2021-10-08 NOTE — ED Notes (Signed)
I provided reinforced discharge education based off of discharge instructions. Pt acknowledged and understood my education. Pt had no further questions/concerns for provider/myself.  °

## 2021-10-08 NOTE — ED Provider Triage Note (Signed)
Emergency Medicine Provider Triage Evaluation Note  Gina Yang , a 21 y.o. female  was evaluated in triage.  Pt complains of sore throat, rhinorrhea, nasal congestion, rhinorrhea onset 2 days.  Denies sick contacts.  Has not tried any medication for symptoms.  Denies trouble swallowing, decreased intake, chest pain, shortness of breath, fever, chills, Donnell pain, nausea, vomiting.  Review of Systems  Positive: As per HPI above Negative: Chest pain, shortness of breath  Physical Exam  BP 135/89 (BP Location: Right Arm)    Pulse 74    Temp 97.8 F (36.6 C) (Oral)    Resp 16    Ht 5\' 6"  (1.676 m)    Wt 91.2 kg    LMP 10/01/2021 (Exact Date)    SpO2 100%    BMI 32.44 kg/m  Gen:   Awake, no distress   Resp:  Normal effort  MSK:   Moves extremities without difficulty  Other:  Patent airway, uvula midline without swelling.  Exudate noted to left tonsil.  No posterior pharyngeal erythema noted.  Full active range of motion of neck.  Mild tenderness to palpation noted to right submandibular lymph nodes without appreciable lymphadenopathy.  Medical Decision Making  Medically screening exam initiated at 11:25 AM.  Appropriate orders placed.  Edana Aguado was informed that the remainder of the evaluation will be completed by another provider, this initial triage assessment does not replace that evaluation, and the importance of remaining in the ED until their evaluation is complete.   Sharonna Vinje A, PA-C 10/08/21 1126

## 2021-11-01 ENCOUNTER — Other Ambulatory Visit: Payer: Self-pay

## 2021-11-01 ENCOUNTER — Encounter (HOSPITAL_COMMUNITY): Payer: Self-pay

## 2021-11-01 ENCOUNTER — Emergency Department (HOSPITAL_COMMUNITY)
Admission: EM | Admit: 2021-11-01 | Discharge: 2021-11-01 | Disposition: A | Discharge status: Home / Self Care | Payer: Medicaid Other | Attending: Emergency Medicine | Admitting: Emergency Medicine

## 2021-11-01 ENCOUNTER — Emergency Department (HOSPITAL_COMMUNITY): Payer: Medicaid Other

## 2021-11-01 DIAGNOSIS — R519 Headache, unspecified: Secondary | ICD-10-CM | POA: Diagnosis not present

## 2021-11-01 DIAGNOSIS — Z91010 Allergy to peanuts: Secondary | ICD-10-CM | POA: Diagnosis not present

## 2021-11-01 DIAGNOSIS — M545 Low back pain, unspecified: Secondary | ICD-10-CM

## 2021-11-01 DIAGNOSIS — M546 Pain in thoracic spine: Secondary | ICD-10-CM | POA: Diagnosis not present

## 2021-11-01 DIAGNOSIS — Y92410 Unspecified street and highway as the place of occurrence of the external cause: Secondary | ICD-10-CM | POA: Insufficient documentation

## 2021-11-01 LAB — PREGNANCY, URINE: Preg Test, Ur: NEGATIVE

## 2021-11-01 MED ORDER — ACETAMINOPHEN 325 MG PO TABS
650.0000 mg | ORAL_TABLET | Freq: Once | ORAL | Status: AC
Start: 2021-11-01 — End: 2021-11-01
  Administered 2021-11-01: 650 mg via ORAL
  Filled 2021-11-01: qty 2

## 2021-11-01 MED ORDER — METHOCARBAMOL 500 MG PO TABS
500.0000 mg | ORAL_TABLET | Freq: Once | ORAL | Status: AC
  Administered 2021-11-01: 500 mg via ORAL
  Filled 2021-11-01: qty 1

## 2021-11-01 MED ORDER — METHOCARBAMOL 500 MG PO TABS: 500.0000 mg | ORAL_TABLET | Freq: Two times a day (BID) | ORAL | 0 refills | Status: DC

## 2021-11-01 NOTE — Discharge Instructions (Addendum)
It was a pleasure taking care of you today! ? ?Your imaging in the ED was negative for fracture or dislocations. You will feel more sore in the morning. You are prescribed Robaxin (muscle relaxer). Do not drive or operate heavy machinery while taking the muscle relaxer. You may take over the counter 600 mg Ibuprofen every 6 hours or 500 mg Tylenol every 6 hours as needed for pain for no more than 7 days. You may apply ice or heat to affected area for up to 15 minutes at a time. Ensure to place a barrier between your skin and the ice/heat. Return to the Emergency Department if you are experiencing increasing/worsening back pain, inability to walk, numbness, weakness, or worsening symptoms.  ?
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
## 2021-11-04 ENCOUNTER — Ambulatory Visit (HOSPITAL_COMMUNITY)
Admission: EM | Admit: 2021-11-04 | Discharge: 2021-11-04 | Disposition: A | Discharge status: Home / Self Care | Payer: Medicaid Other | Attending: Emergency Medicine | Admitting: Emergency Medicine

## 2021-11-04 ENCOUNTER — Other Ambulatory Visit: Payer: Self-pay

## 2021-11-04 ENCOUNTER — Encounter (HOSPITAL_COMMUNITY): Payer: Self-pay

## 2021-11-04 DIAGNOSIS — Z3202 Encounter for pregnancy test, result negative: Secondary | ICD-10-CM

## 2021-11-04 DIAGNOSIS — G43909 Migraine, unspecified, not intractable, without status migrainosus: Secondary | ICD-10-CM

## 2021-11-04 LAB — POC URINE PREG, ED: Preg Test, Ur: NEGATIVE

## 2021-11-04 MED ORDER — KETOROLAC TROMETHAMINE 30 MG/ML IJ SOLN
INTRAMUSCULAR | Status: AC
  Filled 2021-11-04: qty 1

## 2021-11-04 MED ORDER — ACETAMINOPHEN 325 MG PO TABS
975.0000 mg | ORAL_TABLET | Freq: Once | ORAL | Status: AC
  Administered 2021-11-04: 975 mg via ORAL

## 2021-11-04 MED ORDER — ACETAMINOPHEN 325 MG PO TABS
ORAL_TABLET | ORAL | Status: AC
  Filled 2021-11-04: qty 3

## 2021-11-04 MED ORDER — ACETAMINOPHEN 500 MG PO TABS: 1000.0000 mg | ORAL_TABLET | Freq: Once | ORAL | Status: DC

## 2021-11-04 MED ORDER — KETOROLAC TROMETHAMINE 15 MG/ML IJ SOLN
15.0000 mg | Freq: Once | INTRAMUSCULAR | Status: AC
  Administered 2021-11-04: 15 mg via INTRAMUSCULAR
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
## 2021-11-23 ENCOUNTER — Encounter (HOSPITAL_COMMUNITY): Payer: Self-pay

## 2021-11-23 ENCOUNTER — Other Ambulatory Visit: Payer: Self-pay

## 2021-11-23 ENCOUNTER — Emergency Department (HOSPITAL_COMMUNITY)
Admission: EM | Admit: 2021-11-23 | Discharge: 2021-11-23 | Disposition: A | Discharge status: Home / Self Care | Payer: Medicaid Other | Attending: Emergency Medicine | Admitting: Emergency Medicine

## 2021-11-23 DIAGNOSIS — Z113 Encounter for screening for infections with a predominantly sexual mode of transmission: Secondary | ICD-10-CM | POA: Diagnosis present

## 2021-11-23 DIAGNOSIS — Z202 Contact with and (suspected) exposure to infections with a predominantly sexual mode of transmission: Secondary | ICD-10-CM

## 2021-11-23 DIAGNOSIS — Z91010 Allergy to peanuts: Secondary | ICD-10-CM | POA: Insufficient documentation

## 2021-11-23 LAB — URINALYSIS, ROUTINE W REFLEX MICROSCOPIC
Bacteria, UA: NONE SEEN
Bilirubin Urine: NEGATIVE
Glucose, UA: NEGATIVE mg/dL
Hgb urine dipstick: NEGATIVE
Ketones, ur: NEGATIVE mg/dL
Nitrite: NEGATIVE
Protein, ur: NEGATIVE mg/dL
Specific Gravity, Urine: 1.02 (ref 1.005–1.030)
pH: 7 (ref 5.0–8.0)

## 2021-11-23 LAB — WET PREP, GENITAL
Clue Cells Wet Prep HPF POC: NONE SEEN
Sperm: NONE SEEN
Trich, Wet Prep: NONE SEEN
WBC, Wet Prep HPF POC: <10 (ref <10)
Yeast Wet Prep HPF POC: NONE SEEN

## 2021-11-23 LAB — PREGNANCY, URINE: Preg Test, Ur: NEGATIVE

## 2021-11-23 LAB — HIV ANTIBODY (ROUTINE TESTING W REFLEX): HIV Screen 4th Generation wRfx: NONREACTIVE

## 2021-11-23 LAB — RPR: RPR Ser Ql: NONREACTIVE

## 2021-11-23 MED ORDER — VALACYCLOVIR HCL 1 G PO TABS: 1000.0000 mg | ORAL_TABLET | Freq: Three times a day (TID) | ORAL | 0 refills | Status: AC

## 2021-11-23 NOTE — ED Notes (Signed)
Discharge instructions reviewed. Pt states understanding and no further questions. Pt ambulatory with steady gait upon discharge. No s/s of distress noted.  

## 2021-11-23 NOTE — ED Triage Notes (Signed)
Patient had intercourse march 2nd, the condom popped and she has been scratching down there since. She wants HIV and STD testing. Patients partner is positive for herpes. Symptoms started march 13th. Patient is having yellow and white discharge. ?

## 2021-11-23 NOTE — ED Provider Notes (Signed)
?[facility]-EMERGENCY DEPT ?Provider Note ? ? ?CSN: [id number] ?Arrival date & time: 11/23/21  0024 ? ?  ? ?History ? ?Chief Complaint  ?Patient presents with  ? Exposure to STD  ? ? ?Gina Yang is a 21 y.o. female. ? ?The history is provided by the patient.  ?Exposure to STD ?Gina Yang is a 21 y.o. female who presents to the Emergency Department complaining of possible exposure to STD.  On March 2 she had intercourse and the condom popped.  Her partner went and had STD testing and he tested positive for HSV, neg for HIV.  She reports having vaginal itching that started on March 10 but no pain.  She reports yellow/white discharge that started today.  LMP March 7.  She has no known medical problems and takes no medications. ?Had intercourse March 2 and condom popped.  Tested pos for HSV ?Itching 10th, no pain.  Has yellow/white d/c today.   ? ? ?  ? ?Home Medications ?Prior to Admission medications   ?Medication Sig Start Date End Date Taking? Authorizing Provider  ?valACYclovir (VALTREX) 1000 MG tablet Take 1 tablet (1,000 mg total) by mouth 3 (three) times daily. 11/23/21  Yes Tilden Fossa, MD  ?ibuprofen (ADVIL) 800 MG tablet Take 1 tablet (800 mg total) by mouth every 8 (eight) hours as needed. 10/08/20   Georges Mouse, NP  ?methocarbamol (ROBAXIN) 500 MG tablet Take 1 tablet (500 mg total) by mouth 2 (two) times daily. 11/01/21   Blue, Soijett A, PA-C  ?norelgestromin-ethinyl estradiol (ORTHO EVRA) 150-35 MCG/24HR transdermal patch Place 1 patch onto the skin once a week. 10/05/20   Georges Mouse, NP  ?triamcinolone ointment (KENALOG) 0.5 % APPLY TOPICALLY TO THE AFFECTED AREA TWICE DAILY 04/25/21   Georges Mouse, NP  ?   ? ?Allergies    ?Morphine and related, Peanut-containing drug products, and Shrimp [shellfish allergy]   ? ?Review of Systems   ?Review of Systems  ?All other systems reviewed and are negative. ? ?Physical Exam ?Updated Vital Signs ?BP (!) 165/89 (BP Location:  Right Arm)   Pulse 86   Temp 98.8 ?F (37.1 ?C) (Oral)   Resp 16   Ht 5\' 6"  (1.676 m)   Wt 91 kg   SpO2 98%   BMI 32.38 kg/m?  ?Physical Exam ?Vitals and nursing note reviewed.  ?Constitutional:   ?   Appearance: She is well-developed.  ?HENT:  ?   Head: Normocephalic and atraumatic.  ?Cardiovascular:  ?   Rate and Rhythm: Normal rate and regular rhythm.  ?Pulmonary:  ?   Effort: Pulmonary effort is normal. No respiratory distress.  ?Abdominal:  ?   Palpations: Abdomen is soft.  ?   Tenderness: There is no abdominal tenderness. There is no guarding or rebound.  ?Genitourinary: ?   Comments: No CMT.  Os closed.  No significant vaginal discharge.  No vulvar or vaginal lesions. ?Skin: ?   General: Skin is warm and dry.  ?Neurological:  ?   Mental Status: She is alert and oriented to person, place, and time.  ?Psychiatric:     ?   Behavior: Behavior normal.  ? ? ?ED Results / Procedures / Treatments   ?Labs ?(all labs ordered are listed, but only abnormal results are displayed) ?Labs Reviewed  ?URINALYSIS, ROUTINE W REFLEX MICROSCOPIC - Abnormal; Notable for the following components:  ?    Result Value  ? Leukocytes,Ua SMALL (*)   ? All other components within normal  limits  ?WET PREP, GENITAL  ?PREGNANCY, URINE  ?RPR  ?HIV ANTIBODY (ROUTINE TESTING W REFLEX)  ?HSV 2 ANTIBODY, IGG  ?GC/CHLAMYDIA PROBE AMP ([place]) NOT AT [facility]  ? ? ?EKG ?None ? ?Radiology ?No results found. ? ?Procedures ?Procedures  ? ? ?Medications Ordered in ED ?Medications - No data to display ? ?ED Course/ Medical Decision Making/ A&P ?  ?                        ?Medical Decision Making ?Amount and/or Complexity of Data Reviewed ?Labs: ordered. ? ?Risk ?Prescription drug management. ? ? ?Patient here for evaluation of concern for exposure to STD.  Her partner did test positive for HSV, no reports of active lesions.  No lesions noted on patient's examination.  Her pelvic exam is benign and not consistent with PID or cervicitis at this  time.  We will send testing.  Discussed HSV.  Will prescribe Valtrex if patient develops lesions. ? ? ? ? ? ? ? ?Final Clinical Impression(s) / ED Diagnoses ?Final diagnoses:  ?Possible exposure to STD  ? ? ?Rx / DC Orders ?ED Discharge Orders   ? ?      Ordered  ?  valACYclovir (VALTREX) 1000 MG tablet  3 times daily       ? 11/23/21 0204  ? ?  ?  ? ?  ? ? ?  ?Tilden Fossa, MD ?11/23/21 [phone] ? ?

## 2021-11-24 LAB — HSV 2 ANTIBODY, IGG: HSV 2 Glycoprotein G Ab, IgG: <0.91 index (ref 0.00–0.90)

## 2021-11-25 LAB — GC/CHLAMYDIA PROBE AMP (~~LOC~~) NOT AT ARMC
Chlamydia: NEGATIVE
Comment: NEGATIVE
Comment: NORMAL
Neisseria Gonorrhea: NEGATIVE

## 2022-06-02 ENCOUNTER — Other Ambulatory Visit: Payer: Self-pay

## 2022-06-02 ENCOUNTER — Encounter (HOSPITAL_COMMUNITY): Payer: Self-pay

## 2022-06-02 ENCOUNTER — Emergency Department (HOSPITAL_COMMUNITY)
Admission: EM | Admit: 2022-06-02 | Discharge: 2022-06-03 | Disposition: A | Discharge status: Home / Self Care | Payer: Medicaid Other | Attending: Emergency Medicine | Admitting: Emergency Medicine

## 2022-06-02 DIAGNOSIS — Z5321 Procedure and treatment not carried out due to patient leaving prior to being seen by health care provider: Secondary | ICD-10-CM | POA: Insufficient documentation

## 2022-06-02 DIAGNOSIS — R102 Pelvic and perineal pain: Secondary | ICD-10-CM

## 2022-06-02 DIAGNOSIS — R1030 Lower abdominal pain, unspecified: Secondary | ICD-10-CM | POA: Insufficient documentation

## 2022-06-02 DIAGNOSIS — R1110 Vomiting, unspecified: Secondary | ICD-10-CM | POA: Diagnosis not present

## 2022-06-02 DIAGNOSIS — R369 Urethral discharge, unspecified: Secondary | ICD-10-CM | POA: Diagnosis not present

## 2022-06-02 NOTE — ED Triage Notes (Addendum)
Pt reports with vomiting and lower abdominal pain. Pt states that she thinks she has an STD. She is having yellow discharge x 3 days with itching and burning.

## 2022-06-20 ENCOUNTER — Other Ambulatory Visit: Payer: Self-pay | Admitting: Family

## 2022-06-20 DIAGNOSIS — L309 Dermatitis, unspecified: Secondary | ICD-10-CM
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
## 2022-12-12 ENCOUNTER — Ambulatory Visit
Admission: EM | Admit: 2022-12-12 | Discharge: 2022-12-12 | Disposition: A | Discharge status: Home / Self Care | Payer: Medicaid Other | Attending: Internal Medicine | Admitting: Internal Medicine

## 2022-12-12 ENCOUNTER — Encounter: Payer: Self-pay | Admitting: Emergency Medicine

## 2022-12-12 DIAGNOSIS — L03115 Cellulitis of right lower limb: Secondary | ICD-10-CM | POA: Diagnosis not present

## 2022-12-12 MED ORDER — DOXYCYCLINE HYCLATE 100 MG PO CAPS: 100.0000 mg | ORAL_CAPSULE | Freq: Two times a day (BID) | ORAL | 0 refills | Status: AC

## 2022-12-12 MED ORDER — IBUPROFEN 600 MG PO TABS: 600.0000 mg | ORAL_TABLET | Freq: Four times a day (QID) | ORAL | 0 refills | Status: AC | PRN

## 2022-12-12 NOTE — Discharge Instructions (Signed)
Please take medications as directed Please complete the course of antibiotics Take Tylenol or ibuprofen as needed for pain Warm compress over the area involved If you have worsening symptoms please return to urgent care to be reevaluated.

## 2022-12-12 NOTE — ED Provider Notes (Signed)
[facility]    CSN: [id number] Arrival date & time: 12/12/22  1621      History   Chief Complaint Chief Complaint  Patient presents with   Insect Bite    HPI Gina Yang is a 22 y.o. female comes to the urgent care with 2-week history of painful swelling of the right leg.  Symptoms started in a small area and has spread to a larger area.  Patient was thinking that this may be an insect bite.  No fever or chills.  Pain is of moderate severity, aggravated by palpation and not radiating.  No known relieving factors.  Patient has another area of bruising.  This area is mildly tender and in close proximity to the primary area of concern.  No other areas of bruising.  No easy bleeding.  No history of menorrhagia or blood in urine.  No changes in bowel movements.  No night sweats or weight loss.  No masses in the groin, neck or armpits. HPI  Past Medical History:  Diagnosis Date   Allergy    Asthma    Boil of buttock    Eczema     Patient Active Problem List   Diagnosis Date Noted   Superficial burn of right ring finger 06/08/2021   Elevated BP without diagnosis of hypertension 06/02/2017   Obesity 06/02/2017   IUD (intrauterine device) in place 10/10/2015   Hx MRSA infection 06/28/2015   Acne 09/21/2014   Eczema 12/28/2013   Mild persistent asthma 12/28/2013   Allergic rhinitis 12/28/2013    Past Surgical History:  Procedure Laterality Date   NO PAST SURGERIES      OB History     Gravida  0   Para      Term      Preterm      AB      Living         SAB      IAB      Ectopic      Multiple      Live Births               Home Medications    Prior to Admission medications   Medication Sig Start Date End Date Taking? Authorizing Provider  doxycycline (VIBRAMYCIN) 100 MG capsule Take 1 capsule (100 mg total) by mouth 2 (two) times daily for 7 days. 12/12/22 12/19/22 Yes Tatum Corl, Britta Mccreedy, MD  ibuprofen (ADVIL) 600 MG tablet Take 1 tablet  (600 mg total) by mouth every 6 (six) hours as needed. 12/12/22  Yes Geraldine Tesar, Britta Mccreedy, MD  norelgestromin-ethinyl estradiol (ORTHO EVRA) 150-35 MCG/24HR transdermal patch Place 1 patch onto the skin once a week. Patient not taking: Reported on 06/03/2022 10/05/20   Georges Mouse, NP  valACYclovir (VALTREX) 1000 MG tablet Take 1 tablet (1,000 mg total) by mouth 3 (three) times daily. Patient not taking: Reported on 06/03/2022 11/23/21   Tilden Fossa, MD    Family History Family History  Problem Relation Age of Onset   Eczema Brother     Social History Social History   Tobacco Use   Smoking status: Every Day    Types: Cigars   Smokeless tobacco: Never   Tobacco comments:    mom smokes  Vaping Use   Vaping Use: Never used  Substance Use Topics   Alcohol use: Not Currently    Comment: very little    Drug use: Never     Allergies   Morphine and  related, Peanut-containing drug products, and Shrimp [shellfish allergy]   Review of Systems Review of Systems As per HPI.  Physical Exam Triage Vital Signs ED Triage Vitals  Enc Vitals Group     BP 12/12/22 1631 137/78     Pulse Rate 12/12/22 1631 70     Resp 12/12/22 1631 16     Temp 12/12/22 1631 98.1 F (36.7 C)     Temp Source 12/12/22 1631 Oral     SpO2 12/12/22 1631 97 %     Weight --      Height --      Head Circumference --      Peak Flow --      Pain Score 12/12/22 1630 4     Pain Loc --      Pain Edu? --      Excl. in GC? --    No data found.  Updated Vital Signs BP 137/78 (BP Location: Right Arm)   Pulse 70   Temp 98.1 F (36.7 C) (Oral)   Resp 16   LMP 12/09/2022   SpO2 97%   Visual Acuity Right Eye Distance:   Left Eye Distance:   Bilateral Distance:    Right Eye Near:   Left Eye Near:    Bilateral Near:     Physical Exam Vitals and nursing note reviewed.  Constitutional:      General: She is not in acute distress.    Appearance: She is not ill-appearing.  Cardiovascular:     Rate  and Rhythm: Normal rate and regular rhythm.     Pulses: Normal pulses.     Heart sounds: Normal heart sounds.  Pulmonary:     Effort: Pulmonary effort is normal.     Breath sounds: Normal breath sounds.  Skin:    Comments: Tender swelling of the right leg.  Mild erythema associated with the right leg.  Full range of motion of the right knee.  Erythematous area measures about 4 inches in the longest diameter.  No discharge from the area.  Bruise proximal to the primary site measures about 1-1/2 inch in the longest diameter.  It is mildly tender and not erythematous.  Neurological:     Mental Status: She is alert.      UC Treatments / Results  Labs (all labs ordered are listed, but only abnormal results are displayed) Labs Reviewed - No data to display  EKG   Radiology No results found.  Procedures Procedures (including critical care time)  Medications Ordered in UC Medications - No data to display  Initial Impression / Assessment and Plan / UC Course  I have reviewed the triage vital signs and the nursing notes.  Pertinent labs & imaging results that were available during my care of the patient were reviewed by me and considered in my medical decision making (see chart for details).     1.  Cellulitis of the right leg: Doxycycline twice daily for 7 days (chosen to ensure patient is compliant with dosing regimen) Ibuprofen as needed for pain Warm compress Patient is advised to return to urgent care if she notices more bruises or if the primary site symptoms worsen. Final Clinical Impressions(s) / UC Diagnoses   Final diagnoses:  Cellulitis of leg, right     Discharge Instructions      Please take medications as directed Please complete the course of antibiotics Take Tylenol or ibuprofen as needed for pain Warm compress over the area involved If you have worsening  symptoms please return to urgent care to be reevaluated.   ED Prescriptions     Medication Sig  Dispense Auth. Provider   ibuprofen (ADVIL) 600 MG tablet Take 1 tablet (600 mg total) by mouth every 6 (six) hours as needed. 30 tablet Amir Glaus, Britta Mccreedy, MD   doxycycline (VIBRAMYCIN) 100 MG capsule Take 1 capsule (100 mg total) by mouth 2 (two) times daily for 7 days. 14 capsule Zamorah Ailes, Britta Mccreedy, MD      PDMP not reviewed this encounter.   Merrilee Jansky, MD 12/12/22 [phone]

## 2022-12-12 NOTE — ED Triage Notes (Signed)
Pt c/o two possible insect bites on right lateral leg for 2 weeks. Reports has bruising in areas now. Is painful to touch. Denies taking medications or creams

## 2023-04-30 ENCOUNTER — Other Ambulatory Visit: Payer: Self-pay

## 2023-04-30 DIAGNOSIS — K0889 Other specified disorders of teeth and supporting structures: Secondary | ICD-10-CM | POA: Diagnosis present

## 2023-04-30 DIAGNOSIS — Z91010 Allergy to peanuts: Secondary | ICD-10-CM | POA: Insufficient documentation

## 2023-04-30 DIAGNOSIS — K029 Dental caries, unspecified: Secondary | ICD-10-CM | POA: Insufficient documentation

## 2023-05-01 ENCOUNTER — Emergency Department (HOSPITAL_BASED_OUTPATIENT_CLINIC_OR_DEPARTMENT_OTHER)
Admission: EM | Admit: 2023-05-01 | Discharge: 2023-05-01 | Disposition: A | Discharge status: Home / Self Care | Payer: Medicaid Other | Attending: Emergency Medicine | Admitting: Emergency Medicine

## 2023-05-01 ENCOUNTER — Encounter (HOSPITAL_BASED_OUTPATIENT_CLINIC_OR_DEPARTMENT_OTHER): Payer: Self-pay

## 2023-05-01 DIAGNOSIS — K029 Dental caries, unspecified: Secondary | ICD-10-CM

## 2023-05-01 MED ORDER — PENICILLIN V POTASSIUM 500 MG PO TABS: 500.0000 mg | ORAL_TABLET | Freq: Four times a day (QID) | ORAL | 0 refills | Status: AC

## 2023-05-01 MED ORDER — LIDOCAINE VISCOUS HCL 2 % MT SOLN: 5.0000 mL | Freq: Three times a day (TID) | OROMUCOSAL | 0 refills | Status: AC | PRN

## 2023-05-01 MED ORDER — LIDOCAINE VISCOUS HCL 2 % MT SOLN
15.0000 mL | Freq: Once | OROMUCOSAL | Status: AC
  Administered 2023-05-01: 15 mL via OROMUCOSAL
  Filled 2023-05-01: qty 15

## 2023-05-01 MED ORDER — PENICILLIN V POTASSIUM 250 MG PO TABS
500.0000 mg | ORAL_TABLET | Freq: Once | ORAL | Status: AC
  Administered 2023-05-01: 500 mg via ORAL
  Filled 2023-05-01: qty 2

## 2023-05-01 NOTE — ED Triage Notes (Signed)
Pt c/o dental pain, right side bottom x 1 day.  States it is making her have ear pain

## 2023-05-01 NOTE — ED Provider Notes (Signed)
[facility] Provider Note   CSN: [id number] Arrival date & time: 04/30/23  2355     History  Chief Complaint  Patient presents with   Dental Pain    Gina Yang is a 22 y.o. female.  The history is provided by the patient.  Dental Pain Location:  Lower Lower teeth location:  30/RL 1st molar Quality:  Aching Severity:  Severe Onset quality:  Sudden Duration:  1 day Progression:  Unchanged Chronicity:  New Context: filling fell out   Previous work-up:  Dental exam and filled cavity Relieved by:  Nothing Worsened by:  Nothing Ineffective treatments:  NSAIDs Associated symptoms: no congestion, no difficulty swallowing, no fever, no gum swelling, no neck swelling and no oral bleeding        Home Medications Prior to Admission medications   Medication Sig Start Date End Date Taking? Authorizing Provider  magic mouthwash (lidocaine, diphenhydrAMINE, alum & mag hydroxide) suspension Swish and spit 5 mLs 3 (three) times daily as needed for mouth pain. 05/01/23  Yes Firas Guardado, MD  penicillin v potassium (VEETID) 500 MG tablet Take 1 tablet (500 mg total) by mouth 4 (four) times daily for 7 days. 05/01/23 05/08/23 Yes Devonne Lalani, MD  ibuprofen (ADVIL) 600 MG tablet Take 1 tablet (600 mg total) by mouth every 6 (six) hours as needed. 12/12/22   LampteyBritta Mccreedy, MD  norelgestromin-ethinyl estradiol (ORTHO EVRA) 150-35 MCG/24HR transdermal patch Place 1 patch onto the skin once a week. Patient not taking: Reported on 06/03/2022 10/05/20   Georges Mouse, NP  valACYclovir (VALTREX) 1000 MG tablet Take 1 tablet (1,000 mg total) by mouth 3 (three) times daily. Patient not taking: Reported on 06/03/2022 11/23/21   Tilden Fossa, MD      Allergies    Peanut-containing drug products and Shrimp [shellfish allergy]    Review of Systems   Review of Systems  Constitutional:  Negative for fever.  HENT:  Positive for dental problem.  Negative for congestion.   Eyes:  Negative for redness.  Respiratory:  Negative for shortness of breath, wheezing and stridor.   Cardiovascular:  Negative for chest pain.  All other systems reviewed and are negative.   Physical Exam Updated Vital Signs BP (!) 141/94 (BP Location: Left Arm)   Pulse 85   Temp 98 F (36.7 C) (Oral)   Resp 16   Ht 5\' 6"  (1.676 m)   Wt 96.6 kg   LMP 03/19/2023 (Exact Date)   SpO2 100%   BMI 34.38 kg/m  Physical Exam Vitals and nursing note reviewed.  Constitutional:      General: She is not in acute distress.    Appearance: She is well-developed.  HENT:     Head: Normocephalic and atraumatic.     Jaw: No trismus.     Nose: Nose normal.     Mouth/Throat:     Mouth: Mucous membranes are moist.     Pharynx: Oropharynx is clear.      Comments: No trismus  Eyes:     Pupils: Pupils are equal, round, and reactive to light.  Cardiovascular:     Rate and Rhythm: Normal rate and regular rhythm.     Pulses: Normal pulses.     Heart sounds: Normal heart sounds.  Pulmonary:     Effort: Pulmonary effort is normal. No respiratory distress.     Breath sounds: Normal breath sounds.  Abdominal:     General: Bowel sounds are  normal. There is no distension.     Palpations: Abdomen is soft.     Tenderness: There is no abdominal tenderness. There is no guarding or rebound.  Genitourinary:    Vagina: No vaginal discharge.  Musculoskeletal:        General: Normal range of motion.     Cervical back: Neck supple.  Skin:    General: Skin is dry.     Capillary Refill: Capillary refill takes less than 2 seconds.     Findings: No erythema or rash.  Neurological:     General: No focal deficit present.     Deep Tendon Reflexes: Reflexes normal.  Psychiatric:        Mood and Affect: Mood normal.     ED Results / Procedures / Treatments   Labs (all labs ordered are listed, but only abnormal results are displayed) Labs Reviewed - No data to  display  EKG None  Radiology No results found.  Procedures Procedures    Medications Ordered in ED Medications  penicillin v potassium (VEETID) tablet 500 mg (500 mg Oral Given 05/01/23 0207)  lidocaine (XYLOCAINE) 2 % viscous mouth solution 15 mL (15 mLs Mouth/Throat Given 05/01/23 0208)    ED Course/ Medical Decision Making/ A&P                                 Medical Decision Making Patient with missing filling  Amount and/or Complexity of Data Reviewed External Data Reviewed: notes.    Details: Previous notes reviewed   Risk Prescription drug management. Risk Details: Missing filling.  Antibiotics initiated, continue tylenol and ibuprofen.  Magic mouthwash added for additional pain control will need to see dentistry for xrays and definitive care.  Stable for discharge.      Final Clinical Impression(s) / ED Diagnoses Final diagnoses:  Dental caries   Return for intractable cough, coughing up blood, fevers > 100.4 unrelieved by medication, shortness of breath, intractable vomiting, chest pain, shortness of breath, weakness, numbness, changes in speech, facial asymmetry, abdominal pain, passing out, Inability to tolerate liquids or food, cough, altered mental status or any concerns. No signs of systemic illness or infection. The patient is nontoxic-appearing on exam and vital signs are within normal limits.  I have reviewed the triage vital signs and the nursing notes. Pertinent labs & imaging results that were available during my care of the patient were reviewed by me and considered in my medical decision making (see chart for details). After history, exam, and medical workup I feel the patient has been appropriately medically screened and is safe for discharge home. Pertinent diagnoses were discussed with the patient. Patient was given return precautions.    Rx / DC Orders ED Discharge Orders          Ordered    magic mouthwash (lidocaine, diphenhydrAMINE, alum & mag  hydroxide) suspension  3 times daily PRN        05/01/23 0216    penicillin v potassium (VEETID) 500 MG tablet  4 times daily        05/01/23 0216              Josephus Harriger, MD 05/01/23 1610

## 2023-06-23 ENCOUNTER — Ambulatory Visit
Admission: EM | Admit: 2023-06-23 | Discharge: 2023-06-23 | Disposition: A | Discharge status: Home / Self Care | Payer: Medicaid Other | Attending: Internal Medicine | Admitting: Internal Medicine

## 2023-06-23 DIAGNOSIS — S61209A Unspecified open wound of unspecified finger without damage to nail, initial encounter: Secondary | ICD-10-CM

## 2023-06-23 DIAGNOSIS — R220 Localized swelling, mass and lump, head: Secondary | ICD-10-CM

## 2023-06-23 MED ORDER — TETANUS-DIPHTH-ACELL PERTUSSIS 5-2.5-18.5 LF-MCG/0.5 IM SUSY
0.5000 mL | PREFILLED_SYRINGE | Freq: Once | INTRAMUSCULAR | Status: AC
  Administered 2023-06-23: 0.5 mL via INTRAMUSCULAR

## 2023-06-23 NOTE — Discharge Instructions (Addendum)
Keep the wound clean and dry.  Your tetanus was updated in clinic today and is good for 10 years.  Start taking allergy medicine daily for at least the next 7 days.  Please follow-up with your PCP or dentist if your symptoms do not improve.  Please go to the ER for any worsening symptoms.  I hope you feel better soon!

## 2023-06-23 NOTE — ED Triage Notes (Addendum)
Pt presents to UC w/ c/o cutting her right hand 3rd finger on a razor yesterday while she had her hand in a bag. Pt reports some left sided facial swelling that noticed today. She is unsure of when it started, but reports she had a root canal one month ago. Denies pain on the face.

## 2023-06-23 NOTE — ED Provider Notes (Signed)
[facility]    CSN: [id number] Arrival date & time: 06/23/23  1309      History   Chief Complaint No chief complaint on file.   HPI Gina Yang is a 22 y.o. female presents for finger injury and facial swelling.  Patient reports she was getting her hair done today when she noticed the left side of her face seemed swollen.  She states she went back and checked a bunch of pictures and noticed that seem to be swollen for the past month.  She states it does not feel swollen but when she looks in the mirror it looks swollen.  Denies any injury, fevers, lymphadenopathy, pain, dental pain, ear pain, TMJ pain.  Denies anything new including medications, detergents, soaps, lotions, etc.  Does have a history of allergies but typically takes allergy medicine and this spring or summer.  She denies any tongue/lip/throat swelling, difficulty breathing or swallowing.  No pain with chewing.  She states she had a root canal on the right side of her mouth about a month ago.  In addition she reports yesterday while carrying in some groceries she had a razor in the bag and it did cut her right third finger.  She states she put some type of cream on it but otherwise no additional treatment.  She is not on blood thinning medications.  No numbness or tingling of the area.  She is not up-to-date on her tetanus.  No other concerns at this time.  HPI  Past Medical History:  Diagnosis Date   Allergy    Asthma    Boil of buttock    Eczema     Patient Active Problem List   Diagnosis Date Noted   Superficial burn of right ring finger 06/08/2021   Elevated BP without diagnosis of hypertension 06/02/2017   Obesity 06/02/2017   IUD (intrauterine device) in place 10/10/2015   Hx MRSA infection 06/28/2015   Acne 09/21/2014   Eczema 12/28/2013   Mild persistent asthma 12/28/2013   Allergic rhinitis 12/28/2013    Past Surgical History:  Procedure Laterality Date   NO PAST SURGERIES      OB  History     Gravida  0   Para      Term      Preterm      AB      Living         SAB      IAB      Ectopic      Multiple      Live Births               Home Medications    Prior to Admission medications   Medication Sig Start Date End Date Taking? Authorizing Provider  ibuprofen (ADVIL) 600 MG tablet Take 1 tablet (600 mg total) by mouth every 6 (six) hours as needed. 12/12/22   LampteyBritta Mccreedy, MD  magic mouthwash (lidocaine, diphenhydrAMINE, alum & mag hydroxide) suspension Swish and spit 5 mLs 3 (three) times daily as needed for mouth pain. 05/01/23   Palumbo, April, MD  norelgestromin-ethinyl estradiol (ORTHO EVRA) 150-35 MCG/24HR transdermal patch Place 1 patch onto the skin once a week. Patient not taking: Reported on 06/03/2022 10/05/20   Georges Mouse, NP  valACYclovir (VALTREX) 1000 MG tablet Take 1 tablet (1,000 mg total) by mouth 3 (three) times daily. Patient not taking: Reported on 06/03/2022 11/23/21   Tilden Fossa, MD    Family History Family History  Problem Relation Age of Onset   Eczema Brother     Social History Social History   Tobacco Use   Smoking status: Every Day    Types: Cigars   Smokeless tobacco: Never   Tobacco comments:    mom smokes  Vaping Use   Vaping status: Never Used  Substance Use Topics   Alcohol use: Not Currently    Comment: very little    Drug use: Never     Allergies   Peanut-containing drug products and Shrimp [shellfish allergy]   Review of Systems Review of Systems  HENT:         Facial swelling  Skin:  Positive for wound.     Physical Exam Triage Vital Signs ED Triage Vitals [06/23/23 1347]  Encounter Vitals Group     BP (!) 142/96     Systolic BP Percentile      Diastolic BP Percentile      Pulse Rate 72     Resp 16     Temp 97.8 F (36.6 C)     Temp Source Oral     SpO2 96 %     Weight      Height      Head Circumference      Peak Flow      Pain Score      Pain Loc       Pain Education      Exclude from Growth Chart    No data found.  Updated Vital Signs BP (!) 142/96 (BP Location: Left Arm)   Pulse 72   Temp 97.8 F (36.6 C) (Oral)   Resp 16   LMP 06/08/2023 (Exact Date)   SpO2 96%   Visual Acuity Right Eye Distance:   Left Eye Distance:   Bilateral Distance:    Right Eye Near:   Left Eye Near:    Bilateral Near:     Physical Exam Vitals and nursing note reviewed.  Constitutional:      General: She is not in acute distress.    Appearance: Normal appearance. She is not ill-appearing.  HENT:     Head: Normocephalic and atraumatic.     Comments: There is no erythema, rashes, warmth of the left side of her face.  There is no obvious swelling.  No tenderness to palpation to left cheek temple or forehead.  No TMJ tenderness with palpation.    Right Ear: Tympanic membrane and ear canal normal.     Left Ear: Tympanic membrane and ear canal normal.     Nose: Nose normal.     Mouth/Throat:     Mouth: Mucous membranes are moist. No angioedema.     Dentition: No dental tenderness, dental abscesses or gum lesions.     Pharynx: Oropharynx is clear. Uvula midline. No pharyngeal swelling or uvula swelling.  Eyes:     Pupils: Pupils are equal, round, and reactive to light.  Cardiovascular:     Rate and Rhythm: Normal rate.  Pulmonary:     Effort: Pulmonary effort is normal.  Musculoskeletal:       Hands:     Comments: There is a 1.25 cm superficial skin avulsion to the distal right third finger adjacent to the PIP joint.  No nail or nailbed involvement.  No bleeding.  No tenderness with palpation.  Full range of motion of finger without restriction or pain.  Cap refill +2.  Skin:    General: Skin is warm and dry.  Neurological:  General: No focal deficit present.     Mental Status: She is alert and oriented to person, place, and time.  Psychiatric:        Mood and Affect: Mood normal.        Behavior: Behavior normal.      UC Treatments  / Results  Labs (all labs ordered are listed, but only abnormal results are displayed) Labs Reviewed - No data to display  EKG   Radiology No results found.  Procedures Procedures (including critical care time)  Medications Ordered in UC Medications  Tdap (BOOSTRIX) injection 0.5 mL (0.5 mLs Intramuscular Given 06/23/23 1409)    Initial Impression / Assessment and Plan / UC Course  I have reviewed the triage vital signs and the nursing notes.  Pertinent labs & imaging results that were available during my care of the patient were reviewed by me and considered in my medical decision making (see chart for details).     Reviewed exam and symptoms with patient.  No red flags.  Discussed skin avulsion.  Area was cleaned and dressed by nursing staff.  Tetanus was updated in clinic.  Wound care and signs and symptoms of infection were reviewed.  In addition no obvious facial swelling on exam.  There is no airway compromise or signs of dental infection.  Patient is well-appearing and in no acute distress.  Patient reports symptoms ongoing for 1 month but states she feels fine and has not noticed any worsening symptoms.  No red flags regarding this and do not feel ER eval is indicated at this time.  Will do trial of allergy medicine such as OTC Claritin or Zyrtec daily for at least the next 7 days.  I advised her to follow-up with her PCP or dentist in 2 to 3 days if symptoms do not improve.  Strict ER precautions reviewed and patient verbalized understanding. Final Clinical Impressions(s) / UC Diagnoses   Final diagnoses:  Avulsion of skin of finger, initial encounter  Left facial swelling     Discharge Instructions      Keep the wound clean and dry.  Your tetanus was updated in clinic today and is good for 10 years.  Start taking allergy medicine daily for at least the next 7 days.  Please follow-up with your PCP or dentist if your symptoms do not improve.  Please go to the ER for any  worsening symptoms.  I hope you feel better soon!    ED Prescriptions   None    PDMP not reviewed this encounter.   Radford Pax, NP 06/23/23 [phone]

## 2024-01-11 ENCOUNTER — Encounter (HOSPITAL_BASED_OUTPATIENT_CLINIC_OR_DEPARTMENT_OTHER): Payer: Self-pay | Admitting: Emergency Medicine

## 2024-01-11 ENCOUNTER — Other Ambulatory Visit: Payer: Self-pay

## 2024-01-11 ENCOUNTER — Emergency Department (HOSPITAL_BASED_OUTPATIENT_CLINIC_OR_DEPARTMENT_OTHER)
Admission: EM | Admit: 2024-01-11 | Discharge: 2024-01-11 | Discharge status: Against Medical Advice | Attending: Emergency Medicine | Admitting: Emergency Medicine

## 2024-01-11 DIAGNOSIS — M25472 Effusion, left ankle: Secondary | ICD-10-CM | POA: Insufficient documentation

## 2024-01-11 DIAGNOSIS — M25471 Effusion, right ankle: Secondary | ICD-10-CM | POA: Diagnosis not present

## 2024-01-11 DIAGNOSIS — Z5321 Procedure and treatment not carried out due to patient leaving prior to being seen by health care provider: Secondary | ICD-10-CM | POA: Diagnosis not present

## 2024-01-11 NOTE — ED Triage Notes (Signed)
 Pt reports she noticed bilateral ankle/feet swelling yesterday. States her feet hurt when waking up. No obvious swelling in triage. Pt reports no medical hx.
